# Patient Record
Sex: Female | Born: 1935 | Race: Black or African American | Hispanic: No | Marital: Married | Smoking: Never smoker
Health system: Southern US, Community
[De-identification: ages and names within clinical notes are randomized; demographics above are authoritative.]

## PROBLEM LIST (undated history)

## (undated) DIAGNOSIS — E7800 Pure hypercholesterolemia, unspecified: Secondary | ICD-10-CM

## (undated) DIAGNOSIS — I10 Essential (primary) hypertension: Secondary | ICD-10-CM

## (undated) DIAGNOSIS — I2510 Atherosclerotic heart disease of native coronary artery without angina pectoris: Secondary | ICD-10-CM

## (undated) HISTORY — DX: Essential (primary) hypertension: I10

## (undated) HISTORY — PX: HIP FRACTURE SURGERY: SHX118

## (undated) HISTORY — PX: TOTAL KNEE ARTHROPLASTY: SHX125

## (undated) HISTORY — DX: Atherosclerotic heart disease of native coronary artery without angina pectoris: I25.10

## (undated) HISTORY — DX: Pure hypercholesterolemia, unspecified: E78.00

## (undated) HISTORY — PX: CORONARY ARTERY BYPASS GRAFT: SHX141

---

## 2011-02-04 DIAGNOSIS — I359 Nonrheumatic aortic valve disorder, unspecified: Secondary | ICD-10-CM

## 2011-11-12 DIAGNOSIS — I10 Essential (primary) hypertension: Secondary | ICD-10-CM | POA: Diagnosis not present

## 2011-11-12 DIAGNOSIS — F0390 Unspecified dementia without behavioral disturbance: Secondary | ICD-10-CM | POA: Diagnosis not present

## 2011-11-12 DIAGNOSIS — I700 Atherosclerosis of aorta: Secondary | ICD-10-CM | POA: Diagnosis not present

## 2011-11-12 DIAGNOSIS — I2510 Atherosclerotic heart disease of native coronary artery without angina pectoris: Secondary | ICD-10-CM | POA: Diagnosis not present

## 2011-11-12 DIAGNOSIS — E119 Type 2 diabetes mellitus without complications: Secondary | ICD-10-CM | POA: Diagnosis not present

## 2011-11-12 DIAGNOSIS — K219 Gastro-esophageal reflux disease without esophagitis: Secondary | ICD-10-CM | POA: Diagnosis not present

## 2011-11-19 DIAGNOSIS — I10 Essential (primary) hypertension: Secondary | ICD-10-CM | POA: Diagnosis not present

## 2011-11-19 DIAGNOSIS — F0390 Unspecified dementia without behavioral disturbance: Secondary | ICD-10-CM | POA: Diagnosis not present

## 2011-11-19 DIAGNOSIS — I700 Atherosclerosis of aorta: Secondary | ICD-10-CM | POA: Diagnosis not present

## 2011-11-19 DIAGNOSIS — K219 Gastro-esophageal reflux disease without esophagitis: Secondary | ICD-10-CM | POA: Diagnosis not present

## 2011-11-19 DIAGNOSIS — E119 Type 2 diabetes mellitus without complications: Secondary | ICD-10-CM | POA: Diagnosis not present

## 2011-11-19 DIAGNOSIS — I2510 Atherosclerotic heart disease of native coronary artery without angina pectoris: Secondary | ICD-10-CM | POA: Diagnosis not present

## 2011-11-23 DIAGNOSIS — I2510 Atherosclerotic heart disease of native coronary artery without angina pectoris: Secondary | ICD-10-CM | POA: Diagnosis not present

## 2011-11-23 DIAGNOSIS — I10 Essential (primary) hypertension: Secondary | ICD-10-CM | POA: Diagnosis not present

## 2011-11-23 DIAGNOSIS — I700 Atherosclerosis of aorta: Secondary | ICD-10-CM | POA: Diagnosis not present

## 2011-11-23 DIAGNOSIS — E119 Type 2 diabetes mellitus without complications: Secondary | ICD-10-CM | POA: Diagnosis not present

## 2011-11-23 DIAGNOSIS — K219 Gastro-esophageal reflux disease without esophagitis: Secondary | ICD-10-CM | POA: Diagnosis not present

## 2011-11-23 DIAGNOSIS — F0390 Unspecified dementia without behavioral disturbance: Secondary | ICD-10-CM | POA: Diagnosis not present

## 2011-11-26 DIAGNOSIS — I2510 Atherosclerotic heart disease of native coronary artery without angina pectoris: Secondary | ICD-10-CM | POA: Diagnosis not present

## 2011-11-26 DIAGNOSIS — F0390 Unspecified dementia without behavioral disturbance: Secondary | ICD-10-CM | POA: Diagnosis not present

## 2011-11-26 DIAGNOSIS — E119 Type 2 diabetes mellitus without complications: Secondary | ICD-10-CM | POA: Diagnosis not present

## 2011-11-26 DIAGNOSIS — K219 Gastro-esophageal reflux disease without esophagitis: Secondary | ICD-10-CM | POA: Diagnosis not present

## 2011-11-26 DIAGNOSIS — I10 Essential (primary) hypertension: Secondary | ICD-10-CM | POA: Diagnosis not present

## 2011-11-26 DIAGNOSIS — I700 Atherosclerosis of aorta: Secondary | ICD-10-CM | POA: Diagnosis not present

## 2011-11-30 DIAGNOSIS — F0390 Unspecified dementia without behavioral disturbance: Secondary | ICD-10-CM | POA: Diagnosis not present

## 2011-11-30 DIAGNOSIS — K219 Gastro-esophageal reflux disease without esophagitis: Secondary | ICD-10-CM | POA: Diagnosis not present

## 2011-11-30 DIAGNOSIS — I10 Essential (primary) hypertension: Secondary | ICD-10-CM | POA: Diagnosis not present

## 2011-11-30 DIAGNOSIS — I2510 Atherosclerotic heart disease of native coronary artery without angina pectoris: Secondary | ICD-10-CM | POA: Diagnosis not present

## 2011-11-30 DIAGNOSIS — E119 Type 2 diabetes mellitus without complications: Secondary | ICD-10-CM | POA: Diagnosis not present

## 2011-11-30 DIAGNOSIS — I700 Atherosclerosis of aorta: Secondary | ICD-10-CM | POA: Diagnosis not present

## 2011-12-01 DIAGNOSIS — F0390 Unspecified dementia without behavioral disturbance: Secondary | ICD-10-CM | POA: Diagnosis not present

## 2011-12-01 DIAGNOSIS — I2510 Atherosclerotic heart disease of native coronary artery without angina pectoris: Secondary | ICD-10-CM | POA: Diagnosis not present

## 2011-12-01 DIAGNOSIS — I700 Atherosclerosis of aorta: Secondary | ICD-10-CM | POA: Diagnosis not present

## 2011-12-01 DIAGNOSIS — E119 Type 2 diabetes mellitus without complications: Secondary | ICD-10-CM | POA: Diagnosis not present

## 2011-12-01 DIAGNOSIS — K219 Gastro-esophageal reflux disease without esophagitis: Secondary | ICD-10-CM | POA: Diagnosis not present

## 2011-12-01 DIAGNOSIS — I10 Essential (primary) hypertension: Secondary | ICD-10-CM | POA: Diagnosis not present

## 2011-12-03 DIAGNOSIS — K219 Gastro-esophageal reflux disease without esophagitis: Secondary | ICD-10-CM | POA: Diagnosis not present

## 2011-12-03 DIAGNOSIS — E119 Type 2 diabetes mellitus without complications: Secondary | ICD-10-CM | POA: Diagnosis not present

## 2011-12-03 DIAGNOSIS — I700 Atherosclerosis of aorta: Secondary | ICD-10-CM | POA: Diagnosis not present

## 2011-12-03 DIAGNOSIS — F0390 Unspecified dementia without behavioral disturbance: Secondary | ICD-10-CM | POA: Diagnosis not present

## 2011-12-03 DIAGNOSIS — I2510 Atherosclerotic heart disease of native coronary artery without angina pectoris: Secondary | ICD-10-CM | POA: Diagnosis not present

## 2011-12-03 DIAGNOSIS — I10 Essential (primary) hypertension: Secondary | ICD-10-CM | POA: Diagnosis not present

## 2011-12-07 DIAGNOSIS — I2510 Atherosclerotic heart disease of native coronary artery without angina pectoris: Secondary | ICD-10-CM | POA: Diagnosis not present

## 2011-12-07 DIAGNOSIS — I700 Atherosclerosis of aorta: Secondary | ICD-10-CM | POA: Diagnosis not present

## 2011-12-07 DIAGNOSIS — I10 Essential (primary) hypertension: Secondary | ICD-10-CM | POA: Diagnosis not present

## 2011-12-07 DIAGNOSIS — E119 Type 2 diabetes mellitus without complications: Secondary | ICD-10-CM | POA: Diagnosis not present

## 2011-12-07 DIAGNOSIS — K219 Gastro-esophageal reflux disease without esophagitis: Secondary | ICD-10-CM | POA: Diagnosis not present

## 2011-12-07 DIAGNOSIS — F0390 Unspecified dementia without behavioral disturbance: Secondary | ICD-10-CM | POA: Diagnosis not present

## 2011-12-10 DIAGNOSIS — I10 Essential (primary) hypertension: Secondary | ICD-10-CM | POA: Diagnosis not present

## 2011-12-10 DIAGNOSIS — I700 Atherosclerosis of aorta: Secondary | ICD-10-CM | POA: Diagnosis not present

## 2011-12-10 DIAGNOSIS — F0390 Unspecified dementia without behavioral disturbance: Secondary | ICD-10-CM | POA: Diagnosis not present

## 2011-12-10 DIAGNOSIS — I2510 Atherosclerotic heart disease of native coronary artery without angina pectoris: Secondary | ICD-10-CM | POA: Diagnosis not present

## 2011-12-10 DIAGNOSIS — E119 Type 2 diabetes mellitus without complications: Secondary | ICD-10-CM | POA: Diagnosis not present

## 2011-12-10 DIAGNOSIS — K219 Gastro-esophageal reflux disease without esophagitis: Secondary | ICD-10-CM | POA: Diagnosis not present

## 2011-12-17 DIAGNOSIS — F0390 Unspecified dementia without behavioral disturbance: Secondary | ICD-10-CM | POA: Diagnosis not present

## 2011-12-17 DIAGNOSIS — K219 Gastro-esophageal reflux disease without esophagitis: Secondary | ICD-10-CM | POA: Diagnosis not present

## 2011-12-17 DIAGNOSIS — I700 Atherosclerosis of aorta: Secondary | ICD-10-CM | POA: Diagnosis not present

## 2011-12-17 DIAGNOSIS — I10 Essential (primary) hypertension: Secondary | ICD-10-CM | POA: Diagnosis not present

## 2011-12-17 DIAGNOSIS — I2510 Atherosclerotic heart disease of native coronary artery without angina pectoris: Secondary | ICD-10-CM | POA: Diagnosis not present

## 2011-12-17 DIAGNOSIS — E119 Type 2 diabetes mellitus without complications: Secondary | ICD-10-CM | POA: Diagnosis not present

## 2011-12-21 DIAGNOSIS — I2510 Atherosclerotic heart disease of native coronary artery without angina pectoris: Secondary | ICD-10-CM | POA: Diagnosis not present

## 2011-12-21 DIAGNOSIS — K219 Gastro-esophageal reflux disease without esophagitis: Secondary | ICD-10-CM | POA: Diagnosis not present

## 2011-12-21 DIAGNOSIS — I700 Atherosclerosis of aorta: Secondary | ICD-10-CM | POA: Diagnosis not present

## 2011-12-21 DIAGNOSIS — E119 Type 2 diabetes mellitus without complications: Secondary | ICD-10-CM | POA: Diagnosis not present

## 2011-12-21 DIAGNOSIS — I10 Essential (primary) hypertension: Secondary | ICD-10-CM | POA: Diagnosis not present

## 2011-12-21 DIAGNOSIS — F0390 Unspecified dementia without behavioral disturbance: Secondary | ICD-10-CM | POA: Diagnosis not present

## 2011-12-24 DIAGNOSIS — K219 Gastro-esophageal reflux disease without esophagitis: Secondary | ICD-10-CM | POA: Diagnosis not present

## 2011-12-24 DIAGNOSIS — I700 Atherosclerosis of aorta: Secondary | ICD-10-CM | POA: Diagnosis not present

## 2011-12-24 DIAGNOSIS — E119 Type 2 diabetes mellitus without complications: Secondary | ICD-10-CM | POA: Diagnosis not present

## 2011-12-24 DIAGNOSIS — I2510 Atherosclerotic heart disease of native coronary artery without angina pectoris: Secondary | ICD-10-CM | POA: Diagnosis not present

## 2011-12-24 DIAGNOSIS — F0390 Unspecified dementia without behavioral disturbance: Secondary | ICD-10-CM | POA: Diagnosis not present

## 2011-12-24 DIAGNOSIS — I10 Essential (primary) hypertension: Secondary | ICD-10-CM | POA: Diagnosis not present

## 2011-12-28 DIAGNOSIS — E119 Type 2 diabetes mellitus without complications: Secondary | ICD-10-CM | POA: Diagnosis not present

## 2011-12-28 DIAGNOSIS — F0390 Unspecified dementia without behavioral disturbance: Secondary | ICD-10-CM | POA: Diagnosis not present

## 2011-12-28 DIAGNOSIS — K219 Gastro-esophageal reflux disease without esophagitis: Secondary | ICD-10-CM | POA: Diagnosis not present

## 2011-12-28 DIAGNOSIS — I10 Essential (primary) hypertension: Secondary | ICD-10-CM | POA: Diagnosis not present

## 2011-12-28 DIAGNOSIS — I700 Atherosclerosis of aorta: Secondary | ICD-10-CM | POA: Diagnosis not present

## 2011-12-28 DIAGNOSIS — I2510 Atherosclerotic heart disease of native coronary artery without angina pectoris: Secondary | ICD-10-CM | POA: Diagnosis not present

## 2011-12-29 DIAGNOSIS — E119 Type 2 diabetes mellitus without complications: Secondary | ICD-10-CM | POA: Diagnosis not present

## 2011-12-29 DIAGNOSIS — I2510 Atherosclerotic heart disease of native coronary artery without angina pectoris: Secondary | ICD-10-CM | POA: Diagnosis not present

## 2011-12-29 DIAGNOSIS — K219 Gastro-esophageal reflux disease without esophagitis: Secondary | ICD-10-CM | POA: Diagnosis not present

## 2011-12-29 DIAGNOSIS — I700 Atherosclerosis of aorta: Secondary | ICD-10-CM | POA: Diagnosis not present

## 2011-12-29 DIAGNOSIS — I10 Essential (primary) hypertension: Secondary | ICD-10-CM | POA: Diagnosis not present

## 2011-12-29 DIAGNOSIS — F0390 Unspecified dementia without behavioral disturbance: Secondary | ICD-10-CM | POA: Diagnosis not present

## 2011-12-31 DIAGNOSIS — I700 Atherosclerosis of aorta: Secondary | ICD-10-CM | POA: Diagnosis not present

## 2011-12-31 DIAGNOSIS — I10 Essential (primary) hypertension: Secondary | ICD-10-CM | POA: Diagnosis not present

## 2011-12-31 DIAGNOSIS — E119 Type 2 diabetes mellitus without complications: Secondary | ICD-10-CM | POA: Diagnosis not present

## 2011-12-31 DIAGNOSIS — F0390 Unspecified dementia without behavioral disturbance: Secondary | ICD-10-CM | POA: Diagnosis not present

## 2011-12-31 DIAGNOSIS — K219 Gastro-esophageal reflux disease without esophagitis: Secondary | ICD-10-CM | POA: Diagnosis not present

## 2011-12-31 DIAGNOSIS — I2510 Atherosclerotic heart disease of native coronary artery without angina pectoris: Secondary | ICD-10-CM | POA: Diagnosis not present

## 2012-01-04 DIAGNOSIS — I2510 Atherosclerotic heart disease of native coronary artery without angina pectoris: Secondary | ICD-10-CM | POA: Diagnosis not present

## 2012-01-04 DIAGNOSIS — K219 Gastro-esophageal reflux disease without esophagitis: Secondary | ICD-10-CM | POA: Diagnosis not present

## 2012-01-04 DIAGNOSIS — I700 Atherosclerosis of aorta: Secondary | ICD-10-CM | POA: Diagnosis not present

## 2012-01-04 DIAGNOSIS — E119 Type 2 diabetes mellitus without complications: Secondary | ICD-10-CM | POA: Diagnosis not present

## 2012-01-04 DIAGNOSIS — F0390 Unspecified dementia without behavioral disturbance: Secondary | ICD-10-CM | POA: Diagnosis not present

## 2012-01-04 DIAGNOSIS — I10 Essential (primary) hypertension: Secondary | ICD-10-CM | POA: Diagnosis not present

## 2012-01-07 DIAGNOSIS — M159 Polyosteoarthritis, unspecified: Secondary | ICD-10-CM | POA: Diagnosis not present

## 2012-01-07 DIAGNOSIS — E1310 Other specified diabetes mellitus with ketoacidosis without coma: Secondary | ICD-10-CM | POA: Diagnosis not present

## 2012-01-07 DIAGNOSIS — F0390 Unspecified dementia without behavioral disturbance: Secondary | ICD-10-CM | POA: Diagnosis not present

## 2012-01-07 DIAGNOSIS — I10 Essential (primary) hypertension: Secondary | ICD-10-CM | POA: Diagnosis not present

## 2012-01-11 DIAGNOSIS — E119 Type 2 diabetes mellitus without complications: Secondary | ICD-10-CM | POA: Diagnosis not present

## 2012-01-11 DIAGNOSIS — F0390 Unspecified dementia without behavioral disturbance: Secondary | ICD-10-CM | POA: Diagnosis not present

## 2012-01-11 DIAGNOSIS — I10 Essential (primary) hypertension: Secondary | ICD-10-CM | POA: Diagnosis not present

## 2012-01-11 DIAGNOSIS — I2510 Atherosclerotic heart disease of native coronary artery without angina pectoris: Secondary | ICD-10-CM | POA: Diagnosis not present

## 2012-01-11 DIAGNOSIS — I700 Atherosclerosis of aorta: Secondary | ICD-10-CM | POA: Diagnosis not present

## 2012-01-11 DIAGNOSIS — K219 Gastro-esophageal reflux disease without esophagitis: Secondary | ICD-10-CM | POA: Diagnosis not present

## 2012-01-14 DIAGNOSIS — K219 Gastro-esophageal reflux disease without esophagitis: Secondary | ICD-10-CM | POA: Diagnosis not present

## 2012-01-14 DIAGNOSIS — I700 Atherosclerosis of aorta: Secondary | ICD-10-CM | POA: Diagnosis not present

## 2012-01-14 DIAGNOSIS — F0390 Unspecified dementia without behavioral disturbance: Secondary | ICD-10-CM | POA: Diagnosis not present

## 2012-01-14 DIAGNOSIS — I10 Essential (primary) hypertension: Secondary | ICD-10-CM | POA: Diagnosis not present

## 2012-01-14 DIAGNOSIS — I2510 Atherosclerotic heart disease of native coronary artery without angina pectoris: Secondary | ICD-10-CM | POA: Diagnosis not present

## 2012-01-14 DIAGNOSIS — E119 Type 2 diabetes mellitus without complications: Secondary | ICD-10-CM | POA: Diagnosis not present

## 2012-01-18 DIAGNOSIS — I700 Atherosclerosis of aorta: Secondary | ICD-10-CM | POA: Diagnosis not present

## 2012-01-18 DIAGNOSIS — I10 Essential (primary) hypertension: Secondary | ICD-10-CM | POA: Diagnosis not present

## 2012-01-18 DIAGNOSIS — I2510 Atherosclerotic heart disease of native coronary artery without angina pectoris: Secondary | ICD-10-CM | POA: Diagnosis not present

## 2012-01-18 DIAGNOSIS — K219 Gastro-esophageal reflux disease without esophagitis: Secondary | ICD-10-CM | POA: Diagnosis not present

## 2012-01-18 DIAGNOSIS — E119 Type 2 diabetes mellitus without complications: Secondary | ICD-10-CM | POA: Diagnosis not present

## 2012-01-18 DIAGNOSIS — F0390 Unspecified dementia without behavioral disturbance: Secondary | ICD-10-CM | POA: Diagnosis not present

## 2012-01-21 DIAGNOSIS — E119 Type 2 diabetes mellitus without complications: Secondary | ICD-10-CM | POA: Diagnosis not present

## 2012-01-21 DIAGNOSIS — I700 Atherosclerosis of aorta: Secondary | ICD-10-CM | POA: Diagnosis not present

## 2012-01-21 DIAGNOSIS — F0390 Unspecified dementia without behavioral disturbance: Secondary | ICD-10-CM | POA: Diagnosis not present

## 2012-01-21 DIAGNOSIS — K219 Gastro-esophageal reflux disease without esophagitis: Secondary | ICD-10-CM | POA: Diagnosis not present

## 2012-01-21 DIAGNOSIS — I10 Essential (primary) hypertension: Secondary | ICD-10-CM | POA: Diagnosis not present

## 2012-01-21 DIAGNOSIS — I2510 Atherosclerotic heart disease of native coronary artery without angina pectoris: Secondary | ICD-10-CM | POA: Diagnosis not present

## 2012-01-25 DIAGNOSIS — K219 Gastro-esophageal reflux disease without esophagitis: Secondary | ICD-10-CM | POA: Diagnosis not present

## 2012-01-25 DIAGNOSIS — I10 Essential (primary) hypertension: Secondary | ICD-10-CM | POA: Diagnosis not present

## 2012-01-25 DIAGNOSIS — I2510 Atherosclerotic heart disease of native coronary artery without angina pectoris: Secondary | ICD-10-CM | POA: Diagnosis not present

## 2012-01-25 DIAGNOSIS — F0390 Unspecified dementia without behavioral disturbance: Secondary | ICD-10-CM | POA: Diagnosis not present

## 2012-01-25 DIAGNOSIS — E119 Type 2 diabetes mellitus without complications: Secondary | ICD-10-CM | POA: Diagnosis not present

## 2012-01-25 DIAGNOSIS — I700 Atherosclerosis of aorta: Secondary | ICD-10-CM | POA: Diagnosis not present

## 2012-01-26 DIAGNOSIS — F0390 Unspecified dementia without behavioral disturbance: Secondary | ICD-10-CM | POA: Diagnosis not present

## 2012-01-26 DIAGNOSIS — I700 Atherosclerosis of aorta: Secondary | ICD-10-CM | POA: Diagnosis not present

## 2012-01-26 DIAGNOSIS — I2510 Atherosclerotic heart disease of native coronary artery without angina pectoris: Secondary | ICD-10-CM | POA: Diagnosis not present

## 2012-01-26 DIAGNOSIS — I10 Essential (primary) hypertension: Secondary | ICD-10-CM | POA: Diagnosis not present

## 2012-01-26 DIAGNOSIS — E119 Type 2 diabetes mellitus without complications: Secondary | ICD-10-CM | POA: Diagnosis not present

## 2012-01-26 DIAGNOSIS — K219 Gastro-esophageal reflux disease without esophagitis: Secondary | ICD-10-CM | POA: Diagnosis not present

## 2012-01-28 DIAGNOSIS — I10 Essential (primary) hypertension: Secondary | ICD-10-CM | POA: Diagnosis not present

## 2012-01-28 DIAGNOSIS — F0390 Unspecified dementia without behavioral disturbance: Secondary | ICD-10-CM | POA: Diagnosis not present

## 2012-01-28 DIAGNOSIS — K219 Gastro-esophageal reflux disease without esophagitis: Secondary | ICD-10-CM | POA: Diagnosis not present

## 2012-01-28 DIAGNOSIS — E119 Type 2 diabetes mellitus without complications: Secondary | ICD-10-CM | POA: Diagnosis not present

## 2012-01-28 DIAGNOSIS — I700 Atherosclerosis of aorta: Secondary | ICD-10-CM | POA: Diagnosis not present

## 2012-01-28 DIAGNOSIS — I2510 Atherosclerotic heart disease of native coronary artery without angina pectoris: Secondary | ICD-10-CM | POA: Diagnosis not present

## 2012-02-01 DIAGNOSIS — I10 Essential (primary) hypertension: Secondary | ICD-10-CM | POA: Diagnosis not present

## 2012-02-01 DIAGNOSIS — I2510 Atherosclerotic heart disease of native coronary artery without angina pectoris: Secondary | ICD-10-CM | POA: Diagnosis not present

## 2012-02-01 DIAGNOSIS — E119 Type 2 diabetes mellitus without complications: Secondary | ICD-10-CM | POA: Diagnosis not present

## 2012-02-01 DIAGNOSIS — F0390 Unspecified dementia without behavioral disturbance: Secondary | ICD-10-CM | POA: Diagnosis not present

## 2012-02-01 DIAGNOSIS — I700 Atherosclerosis of aorta: Secondary | ICD-10-CM | POA: Diagnosis not present

## 2012-02-01 DIAGNOSIS — K219 Gastro-esophageal reflux disease without esophagitis: Secondary | ICD-10-CM | POA: Diagnosis not present

## 2012-02-04 DIAGNOSIS — K219 Gastro-esophageal reflux disease without esophagitis: Secondary | ICD-10-CM | POA: Diagnosis not present

## 2012-02-04 DIAGNOSIS — I700 Atherosclerosis of aorta: Secondary | ICD-10-CM | POA: Diagnosis not present

## 2012-02-04 DIAGNOSIS — I10 Essential (primary) hypertension: Secondary | ICD-10-CM | POA: Diagnosis not present

## 2012-02-04 DIAGNOSIS — E119 Type 2 diabetes mellitus without complications: Secondary | ICD-10-CM | POA: Diagnosis not present

## 2012-02-04 DIAGNOSIS — I2510 Atherosclerotic heart disease of native coronary artery without angina pectoris: Secondary | ICD-10-CM | POA: Diagnosis not present

## 2012-02-04 DIAGNOSIS — F0390 Unspecified dementia without behavioral disturbance: Secondary | ICD-10-CM | POA: Diagnosis not present

## 2012-02-08 DIAGNOSIS — E119 Type 2 diabetes mellitus without complications: Secondary | ICD-10-CM | POA: Diagnosis not present

## 2012-02-08 DIAGNOSIS — I10 Essential (primary) hypertension: Secondary | ICD-10-CM | POA: Diagnosis not present

## 2012-02-08 DIAGNOSIS — F0390 Unspecified dementia without behavioral disturbance: Secondary | ICD-10-CM | POA: Diagnosis not present

## 2012-02-08 DIAGNOSIS — K219 Gastro-esophageal reflux disease without esophagitis: Secondary | ICD-10-CM | POA: Diagnosis not present

## 2012-02-08 DIAGNOSIS — I700 Atherosclerosis of aorta: Secondary | ICD-10-CM | POA: Diagnosis not present

## 2012-02-08 DIAGNOSIS — I2510 Atherosclerotic heart disease of native coronary artery without angina pectoris: Secondary | ICD-10-CM | POA: Diagnosis not present

## 2012-02-11 DIAGNOSIS — I2510 Atherosclerotic heart disease of native coronary artery without angina pectoris: Secondary | ICD-10-CM | POA: Diagnosis not present

## 2012-02-11 DIAGNOSIS — I10 Essential (primary) hypertension: Secondary | ICD-10-CM | POA: Diagnosis not present

## 2012-02-11 DIAGNOSIS — K219 Gastro-esophageal reflux disease without esophagitis: Secondary | ICD-10-CM | POA: Diagnosis not present

## 2012-02-11 DIAGNOSIS — I700 Atherosclerosis of aorta: Secondary | ICD-10-CM | POA: Diagnosis not present

## 2012-02-11 DIAGNOSIS — F0390 Unspecified dementia without behavioral disturbance: Secondary | ICD-10-CM | POA: Diagnosis not present

## 2012-02-11 DIAGNOSIS — E119 Type 2 diabetes mellitus without complications: Secondary | ICD-10-CM | POA: Diagnosis not present

## 2012-02-15 DIAGNOSIS — E119 Type 2 diabetes mellitus without complications: Secondary | ICD-10-CM | POA: Diagnosis not present

## 2012-02-15 DIAGNOSIS — I10 Essential (primary) hypertension: Secondary | ICD-10-CM | POA: Diagnosis not present

## 2012-02-15 DIAGNOSIS — F0390 Unspecified dementia without behavioral disturbance: Secondary | ICD-10-CM | POA: Diagnosis not present

## 2012-02-15 DIAGNOSIS — I2510 Atherosclerotic heart disease of native coronary artery without angina pectoris: Secondary | ICD-10-CM | POA: Diagnosis not present

## 2012-02-15 DIAGNOSIS — I700 Atherosclerosis of aorta: Secondary | ICD-10-CM | POA: Diagnosis not present

## 2012-02-15 DIAGNOSIS — K219 Gastro-esophageal reflux disease without esophagitis: Secondary | ICD-10-CM | POA: Diagnosis not present

## 2012-02-18 DIAGNOSIS — I2510 Atherosclerotic heart disease of native coronary artery without angina pectoris: Secondary | ICD-10-CM | POA: Diagnosis not present

## 2012-02-18 DIAGNOSIS — E119 Type 2 diabetes mellitus without complications: Secondary | ICD-10-CM | POA: Diagnosis not present

## 2012-02-18 DIAGNOSIS — F0390 Unspecified dementia without behavioral disturbance: Secondary | ICD-10-CM | POA: Diagnosis not present

## 2012-02-18 DIAGNOSIS — I700 Atherosclerosis of aorta: Secondary | ICD-10-CM | POA: Diagnosis not present

## 2012-02-18 DIAGNOSIS — K219 Gastro-esophageal reflux disease without esophagitis: Secondary | ICD-10-CM | POA: Diagnosis not present

## 2012-02-18 DIAGNOSIS — I10 Essential (primary) hypertension: Secondary | ICD-10-CM | POA: Diagnosis not present

## 2012-02-23 DIAGNOSIS — I2510 Atherosclerotic heart disease of native coronary artery without angina pectoris: Secondary | ICD-10-CM | POA: Diagnosis not present

## 2012-02-23 DIAGNOSIS — I10 Essential (primary) hypertension: Secondary | ICD-10-CM | POA: Diagnosis not present

## 2012-02-23 DIAGNOSIS — F0390 Unspecified dementia without behavioral disturbance: Secondary | ICD-10-CM | POA: Diagnosis not present

## 2012-02-23 DIAGNOSIS — K219 Gastro-esophageal reflux disease without esophagitis: Secondary | ICD-10-CM | POA: Diagnosis not present

## 2012-02-23 DIAGNOSIS — I700 Atherosclerosis of aorta: Secondary | ICD-10-CM | POA: Diagnosis not present

## 2012-02-23 DIAGNOSIS — E119 Type 2 diabetes mellitus without complications: Secondary | ICD-10-CM | POA: Diagnosis not present

## 2012-02-25 DIAGNOSIS — E119 Type 2 diabetes mellitus without complications: Secondary | ICD-10-CM | POA: Diagnosis not present

## 2012-02-25 DIAGNOSIS — I700 Atherosclerosis of aorta: Secondary | ICD-10-CM | POA: Diagnosis not present

## 2012-02-25 DIAGNOSIS — K219 Gastro-esophageal reflux disease without esophagitis: Secondary | ICD-10-CM | POA: Diagnosis not present

## 2012-02-25 DIAGNOSIS — I2510 Atherosclerotic heart disease of native coronary artery without angina pectoris: Secondary | ICD-10-CM | POA: Diagnosis not present

## 2012-02-25 DIAGNOSIS — I10 Essential (primary) hypertension: Secondary | ICD-10-CM | POA: Diagnosis not present

## 2012-02-25 DIAGNOSIS — F0390 Unspecified dementia without behavioral disturbance: Secondary | ICD-10-CM | POA: Diagnosis not present

## 2012-02-29 DIAGNOSIS — E1139 Type 2 diabetes mellitus with other diabetic ophthalmic complication: Secondary | ICD-10-CM | POA: Diagnosis not present

## 2012-02-29 DIAGNOSIS — H40019 Open angle with borderline findings, low risk, unspecified eye: Secondary | ICD-10-CM | POA: Diagnosis not present

## 2012-02-29 DIAGNOSIS — H35049 Retinal micro-aneurysms, unspecified, unspecified eye: Secondary | ICD-10-CM | POA: Diagnosis not present

## 2012-02-29 DIAGNOSIS — H409 Unspecified glaucoma: Secondary | ICD-10-CM | POA: Diagnosis not present

## 2012-03-07 DIAGNOSIS — E119 Type 2 diabetes mellitus without complications: Secondary | ICD-10-CM | POA: Diagnosis not present

## 2012-03-07 DIAGNOSIS — I700 Atherosclerosis of aorta: Secondary | ICD-10-CM | POA: Diagnosis not present

## 2012-03-07 DIAGNOSIS — F0390 Unspecified dementia without behavioral disturbance: Secondary | ICD-10-CM | POA: Diagnosis not present

## 2012-03-07 DIAGNOSIS — K219 Gastro-esophageal reflux disease without esophagitis: Secondary | ICD-10-CM | POA: Diagnosis not present

## 2012-03-07 DIAGNOSIS — I2510 Atherosclerotic heart disease of native coronary artery without angina pectoris: Secondary | ICD-10-CM | POA: Diagnosis not present

## 2012-03-07 DIAGNOSIS — I10 Essential (primary) hypertension: Secondary | ICD-10-CM | POA: Diagnosis not present

## 2012-03-10 DIAGNOSIS — I10 Essential (primary) hypertension: Secondary | ICD-10-CM | POA: Diagnosis not present

## 2012-03-10 DIAGNOSIS — I2510 Atherosclerotic heart disease of native coronary artery without angina pectoris: Secondary | ICD-10-CM | POA: Diagnosis not present

## 2012-03-10 DIAGNOSIS — F0390 Unspecified dementia without behavioral disturbance: Secondary | ICD-10-CM | POA: Diagnosis not present

## 2012-03-10 DIAGNOSIS — K219 Gastro-esophageal reflux disease without esophagitis: Secondary | ICD-10-CM | POA: Diagnosis not present

## 2012-03-10 DIAGNOSIS — E119 Type 2 diabetes mellitus without complications: Secondary | ICD-10-CM | POA: Diagnosis not present

## 2012-03-10 DIAGNOSIS — I700 Atherosclerosis of aorta: Secondary | ICD-10-CM | POA: Diagnosis not present

## 2012-03-11 DIAGNOSIS — I2510 Atherosclerotic heart disease of native coronary artery without angina pectoris: Secondary | ICD-10-CM | POA: Diagnosis not present

## 2012-03-11 DIAGNOSIS — E119 Type 2 diabetes mellitus without complications: Secondary | ICD-10-CM | POA: Diagnosis not present

## 2012-03-11 DIAGNOSIS — F0390 Unspecified dementia without behavioral disturbance: Secondary | ICD-10-CM | POA: Diagnosis not present

## 2012-03-11 DIAGNOSIS — I10 Essential (primary) hypertension: Secondary | ICD-10-CM | POA: Diagnosis not present

## 2012-03-11 DIAGNOSIS — I700 Atherosclerosis of aorta: Secondary | ICD-10-CM | POA: Diagnosis not present

## 2012-03-11 DIAGNOSIS — K219 Gastro-esophageal reflux disease without esophagitis: Secondary | ICD-10-CM | POA: Diagnosis not present

## 2012-03-14 DIAGNOSIS — I10 Essential (primary) hypertension: Secondary | ICD-10-CM | POA: Diagnosis not present

## 2012-03-14 DIAGNOSIS — I2510 Atherosclerotic heart disease of native coronary artery without angina pectoris: Secondary | ICD-10-CM | POA: Diagnosis not present

## 2012-03-14 DIAGNOSIS — E119 Type 2 diabetes mellitus without complications: Secondary | ICD-10-CM | POA: Diagnosis not present

## 2012-03-14 DIAGNOSIS — I700 Atherosclerosis of aorta: Secondary | ICD-10-CM | POA: Diagnosis not present

## 2012-03-14 DIAGNOSIS — F0390 Unspecified dementia without behavioral disturbance: Secondary | ICD-10-CM | POA: Diagnosis not present

## 2012-03-14 DIAGNOSIS — K219 Gastro-esophageal reflux disease without esophagitis: Secondary | ICD-10-CM | POA: Diagnosis not present

## 2012-03-17 DIAGNOSIS — F0390 Unspecified dementia without behavioral disturbance: Secondary | ICD-10-CM | POA: Diagnosis not present

## 2012-03-17 DIAGNOSIS — I10 Essential (primary) hypertension: Secondary | ICD-10-CM | POA: Diagnosis not present

## 2012-03-17 DIAGNOSIS — I2510 Atherosclerotic heart disease of native coronary artery without angina pectoris: Secondary | ICD-10-CM | POA: Diagnosis not present

## 2012-03-17 DIAGNOSIS — E119 Type 2 diabetes mellitus without complications: Secondary | ICD-10-CM | POA: Diagnosis not present

## 2012-03-17 DIAGNOSIS — I700 Atherosclerosis of aorta: Secondary | ICD-10-CM | POA: Diagnosis not present

## 2012-03-17 DIAGNOSIS — K219 Gastro-esophageal reflux disease without esophagitis: Secondary | ICD-10-CM | POA: Diagnosis not present

## 2012-03-21 DIAGNOSIS — F0390 Unspecified dementia without behavioral disturbance: Secondary | ICD-10-CM | POA: Diagnosis not present

## 2012-03-21 DIAGNOSIS — K219 Gastro-esophageal reflux disease without esophagitis: Secondary | ICD-10-CM | POA: Diagnosis not present

## 2012-03-21 DIAGNOSIS — E119 Type 2 diabetes mellitus without complications: Secondary | ICD-10-CM | POA: Diagnosis not present

## 2012-03-21 DIAGNOSIS — I700 Atherosclerosis of aorta: Secondary | ICD-10-CM | POA: Diagnosis not present

## 2012-03-21 DIAGNOSIS — I2510 Atherosclerotic heart disease of native coronary artery without angina pectoris: Secondary | ICD-10-CM | POA: Diagnosis not present

## 2012-03-21 DIAGNOSIS — I10 Essential (primary) hypertension: Secondary | ICD-10-CM | POA: Diagnosis not present

## 2012-03-24 DIAGNOSIS — E119 Type 2 diabetes mellitus without complications: Secondary | ICD-10-CM | POA: Diagnosis not present

## 2012-03-24 DIAGNOSIS — F0390 Unspecified dementia without behavioral disturbance: Secondary | ICD-10-CM | POA: Diagnosis not present

## 2012-03-24 DIAGNOSIS — I700 Atherosclerosis of aorta: Secondary | ICD-10-CM | POA: Diagnosis not present

## 2012-03-24 DIAGNOSIS — K219 Gastro-esophageal reflux disease without esophagitis: Secondary | ICD-10-CM | POA: Diagnosis not present

## 2012-03-24 DIAGNOSIS — I2510 Atherosclerotic heart disease of native coronary artery without angina pectoris: Secondary | ICD-10-CM | POA: Diagnosis not present

## 2012-03-24 DIAGNOSIS — I10 Essential (primary) hypertension: Secondary | ICD-10-CM | POA: Diagnosis not present

## 2012-03-28 DIAGNOSIS — K219 Gastro-esophageal reflux disease without esophagitis: Secondary | ICD-10-CM | POA: Diagnosis not present

## 2012-03-28 DIAGNOSIS — E119 Type 2 diabetes mellitus without complications: Secondary | ICD-10-CM | POA: Diagnosis not present

## 2012-03-28 DIAGNOSIS — F0390 Unspecified dementia without behavioral disturbance: Secondary | ICD-10-CM | POA: Diagnosis not present

## 2012-03-28 DIAGNOSIS — I2510 Atherosclerotic heart disease of native coronary artery without angina pectoris: Secondary | ICD-10-CM | POA: Diagnosis not present

## 2012-03-28 DIAGNOSIS — I700 Atherosclerosis of aorta: Secondary | ICD-10-CM | POA: Diagnosis not present

## 2012-03-28 DIAGNOSIS — I10 Essential (primary) hypertension: Secondary | ICD-10-CM | POA: Diagnosis not present

## 2012-03-31 DIAGNOSIS — F0390 Unspecified dementia without behavioral disturbance: Secondary | ICD-10-CM | POA: Diagnosis not present

## 2012-03-31 DIAGNOSIS — K219 Gastro-esophageal reflux disease without esophagitis: Secondary | ICD-10-CM | POA: Diagnosis not present

## 2012-03-31 DIAGNOSIS — I10 Essential (primary) hypertension: Secondary | ICD-10-CM | POA: Diagnosis not present

## 2012-03-31 DIAGNOSIS — E119 Type 2 diabetes mellitus without complications: Secondary | ICD-10-CM | POA: Diagnosis not present

## 2012-03-31 DIAGNOSIS — I2510 Atherosclerotic heart disease of native coronary artery without angina pectoris: Secondary | ICD-10-CM | POA: Diagnosis not present

## 2012-03-31 DIAGNOSIS — I700 Atherosclerosis of aorta: Secondary | ICD-10-CM | POA: Diagnosis not present

## 2012-04-04 DIAGNOSIS — I700 Atherosclerosis of aorta: Secondary | ICD-10-CM | POA: Diagnosis not present

## 2012-04-04 DIAGNOSIS — E119 Type 2 diabetes mellitus without complications: Secondary | ICD-10-CM | POA: Diagnosis not present

## 2012-04-04 DIAGNOSIS — I10 Essential (primary) hypertension: Secondary | ICD-10-CM | POA: Diagnosis not present

## 2012-04-04 DIAGNOSIS — F0390 Unspecified dementia without behavioral disturbance: Secondary | ICD-10-CM | POA: Diagnosis not present

## 2012-04-04 DIAGNOSIS — I2510 Atherosclerotic heart disease of native coronary artery without angina pectoris: Secondary | ICD-10-CM | POA: Diagnosis not present

## 2012-04-04 DIAGNOSIS — K219 Gastro-esophageal reflux disease without esophagitis: Secondary | ICD-10-CM | POA: Diagnosis not present

## 2012-04-05 DIAGNOSIS — I10 Essential (primary) hypertension: Secondary | ICD-10-CM | POA: Diagnosis not present

## 2012-04-05 DIAGNOSIS — E119 Type 2 diabetes mellitus without complications: Secondary | ICD-10-CM | POA: Diagnosis not present

## 2012-04-05 DIAGNOSIS — I700 Atherosclerosis of aorta: Secondary | ICD-10-CM | POA: Diagnosis not present

## 2012-04-05 DIAGNOSIS — F0390 Unspecified dementia without behavioral disturbance: Secondary | ICD-10-CM | POA: Diagnosis not present

## 2012-04-05 DIAGNOSIS — K219 Gastro-esophageal reflux disease without esophagitis: Secondary | ICD-10-CM | POA: Diagnosis not present

## 2012-04-05 DIAGNOSIS — I2510 Atherosclerotic heart disease of native coronary artery without angina pectoris: Secondary | ICD-10-CM | POA: Diagnosis not present

## 2012-04-06 DIAGNOSIS — E1310 Other specified diabetes mellitus with ketoacidosis without coma: Secondary | ICD-10-CM | POA: Diagnosis not present

## 2012-04-06 DIAGNOSIS — E782 Mixed hyperlipidemia: Secondary | ICD-10-CM | POA: Diagnosis not present

## 2012-04-06 DIAGNOSIS — I10 Essential (primary) hypertension: Secondary | ICD-10-CM | POA: Diagnosis not present

## 2012-04-07 DIAGNOSIS — F0390 Unspecified dementia without behavioral disturbance: Secondary | ICD-10-CM | POA: Diagnosis not present

## 2012-04-07 DIAGNOSIS — I2510 Atherosclerotic heart disease of native coronary artery without angina pectoris: Secondary | ICD-10-CM | POA: Diagnosis not present

## 2012-04-07 DIAGNOSIS — I10 Essential (primary) hypertension: Secondary | ICD-10-CM | POA: Diagnosis not present

## 2012-04-07 DIAGNOSIS — I700 Atherosclerosis of aorta: Secondary | ICD-10-CM | POA: Diagnosis not present

## 2012-04-07 DIAGNOSIS — K219 Gastro-esophageal reflux disease without esophagitis: Secondary | ICD-10-CM | POA: Diagnosis not present

## 2012-04-07 DIAGNOSIS — E119 Type 2 diabetes mellitus without complications: Secondary | ICD-10-CM | POA: Diagnosis not present

## 2012-04-11 DIAGNOSIS — I10 Essential (primary) hypertension: Secondary | ICD-10-CM | POA: Diagnosis not present

## 2012-04-11 DIAGNOSIS — K219 Gastro-esophageal reflux disease without esophagitis: Secondary | ICD-10-CM | POA: Diagnosis not present

## 2012-04-11 DIAGNOSIS — I2510 Atherosclerotic heart disease of native coronary artery without angina pectoris: Secondary | ICD-10-CM | POA: Diagnosis not present

## 2012-04-11 DIAGNOSIS — I700 Atherosclerosis of aorta: Secondary | ICD-10-CM | POA: Diagnosis not present

## 2012-04-11 DIAGNOSIS — E119 Type 2 diabetes mellitus without complications: Secondary | ICD-10-CM | POA: Diagnosis not present

## 2012-04-11 DIAGNOSIS — F0390 Unspecified dementia without behavioral disturbance: Secondary | ICD-10-CM | POA: Diagnosis not present

## 2012-04-14 DIAGNOSIS — I2510 Atherosclerotic heart disease of native coronary artery without angina pectoris: Secondary | ICD-10-CM | POA: Diagnosis not present

## 2012-04-14 DIAGNOSIS — E119 Type 2 diabetes mellitus without complications: Secondary | ICD-10-CM | POA: Diagnosis not present

## 2012-04-14 DIAGNOSIS — K219 Gastro-esophageal reflux disease without esophagitis: Secondary | ICD-10-CM | POA: Diagnosis not present

## 2012-04-14 DIAGNOSIS — I10 Essential (primary) hypertension: Secondary | ICD-10-CM | POA: Diagnosis not present

## 2012-04-14 DIAGNOSIS — I700 Atherosclerosis of aorta: Secondary | ICD-10-CM | POA: Diagnosis not present

## 2012-04-14 DIAGNOSIS — F0390 Unspecified dementia without behavioral disturbance: Secondary | ICD-10-CM | POA: Diagnosis not present

## 2012-04-18 DIAGNOSIS — K219 Gastro-esophageal reflux disease without esophagitis: Secondary | ICD-10-CM | POA: Diagnosis not present

## 2012-04-18 DIAGNOSIS — F0390 Unspecified dementia without behavioral disturbance: Secondary | ICD-10-CM | POA: Diagnosis not present

## 2012-04-18 DIAGNOSIS — E119 Type 2 diabetes mellitus without complications: Secondary | ICD-10-CM | POA: Diagnosis not present

## 2012-04-18 DIAGNOSIS — I700 Atherosclerosis of aorta: Secondary | ICD-10-CM | POA: Diagnosis not present

## 2012-04-18 DIAGNOSIS — I10 Essential (primary) hypertension: Secondary | ICD-10-CM | POA: Diagnosis not present

## 2012-04-18 DIAGNOSIS — I2510 Atherosclerotic heart disease of native coronary artery without angina pectoris: Secondary | ICD-10-CM | POA: Diagnosis not present

## 2012-04-21 DIAGNOSIS — K219 Gastro-esophageal reflux disease without esophagitis: Secondary | ICD-10-CM | POA: Diagnosis not present

## 2012-04-21 DIAGNOSIS — I700 Atherosclerosis of aorta: Secondary | ICD-10-CM | POA: Diagnosis not present

## 2012-04-21 DIAGNOSIS — I10 Essential (primary) hypertension: Secondary | ICD-10-CM | POA: Diagnosis not present

## 2012-04-21 DIAGNOSIS — F0390 Unspecified dementia without behavioral disturbance: Secondary | ICD-10-CM | POA: Diagnosis not present

## 2012-04-21 DIAGNOSIS — E119 Type 2 diabetes mellitus without complications: Secondary | ICD-10-CM | POA: Diagnosis not present

## 2012-04-21 DIAGNOSIS — I2510 Atherosclerotic heart disease of native coronary artery without angina pectoris: Secondary | ICD-10-CM | POA: Diagnosis not present

## 2012-04-25 DIAGNOSIS — K219 Gastro-esophageal reflux disease without esophagitis: Secondary | ICD-10-CM | POA: Diagnosis not present

## 2012-04-25 DIAGNOSIS — E119 Type 2 diabetes mellitus without complications: Secondary | ICD-10-CM | POA: Diagnosis not present

## 2012-04-25 DIAGNOSIS — I700 Atherosclerosis of aorta: Secondary | ICD-10-CM | POA: Diagnosis not present

## 2012-04-25 DIAGNOSIS — F0390 Unspecified dementia without behavioral disturbance: Secondary | ICD-10-CM | POA: Diagnosis not present

## 2012-04-25 DIAGNOSIS — I10 Essential (primary) hypertension: Secondary | ICD-10-CM | POA: Diagnosis not present

## 2012-04-25 DIAGNOSIS — I2510 Atherosclerotic heart disease of native coronary artery without angina pectoris: Secondary | ICD-10-CM | POA: Diagnosis not present

## 2012-04-27 DIAGNOSIS — E119 Type 2 diabetes mellitus without complications: Secondary | ICD-10-CM | POA: Diagnosis not present

## 2012-04-27 DIAGNOSIS — I10 Essential (primary) hypertension: Secondary | ICD-10-CM | POA: Diagnosis not present

## 2012-04-27 DIAGNOSIS — F0390 Unspecified dementia without behavioral disturbance: Secondary | ICD-10-CM | POA: Diagnosis not present

## 2012-04-27 DIAGNOSIS — K219 Gastro-esophageal reflux disease without esophagitis: Secondary | ICD-10-CM | POA: Diagnosis not present

## 2012-04-27 DIAGNOSIS — I700 Atherosclerosis of aorta: Secondary | ICD-10-CM | POA: Diagnosis not present

## 2012-04-27 DIAGNOSIS — I2510 Atherosclerotic heart disease of native coronary artery without angina pectoris: Secondary | ICD-10-CM | POA: Diagnosis not present

## 2012-05-02 DIAGNOSIS — I2510 Atherosclerotic heart disease of native coronary artery without angina pectoris: Secondary | ICD-10-CM | POA: Diagnosis not present

## 2012-05-02 DIAGNOSIS — K219 Gastro-esophageal reflux disease without esophagitis: Secondary | ICD-10-CM | POA: Diagnosis not present

## 2012-05-02 DIAGNOSIS — F0390 Unspecified dementia without behavioral disturbance: Secondary | ICD-10-CM | POA: Diagnosis not present

## 2012-05-02 DIAGNOSIS — E119 Type 2 diabetes mellitus without complications: Secondary | ICD-10-CM | POA: Diagnosis not present

## 2012-05-02 DIAGNOSIS — I700 Atherosclerosis of aorta: Secondary | ICD-10-CM | POA: Diagnosis not present

## 2012-05-02 DIAGNOSIS — I10 Essential (primary) hypertension: Secondary | ICD-10-CM | POA: Diagnosis not present

## 2012-05-04 DIAGNOSIS — I2510 Atherosclerotic heart disease of native coronary artery without angina pectoris: Secondary | ICD-10-CM | POA: Diagnosis not present

## 2012-05-04 DIAGNOSIS — E119 Type 2 diabetes mellitus without complications: Secondary | ICD-10-CM | POA: Diagnosis not present

## 2012-05-04 DIAGNOSIS — F0390 Unspecified dementia without behavioral disturbance: Secondary | ICD-10-CM | POA: Diagnosis not present

## 2012-05-04 DIAGNOSIS — K219 Gastro-esophageal reflux disease without esophagitis: Secondary | ICD-10-CM | POA: Diagnosis not present

## 2012-05-04 DIAGNOSIS — I10 Essential (primary) hypertension: Secondary | ICD-10-CM | POA: Diagnosis not present

## 2012-05-04 DIAGNOSIS — I700 Atherosclerosis of aorta: Secondary | ICD-10-CM | POA: Diagnosis not present

## 2012-05-05 DIAGNOSIS — E119 Type 2 diabetes mellitus without complications: Secondary | ICD-10-CM | POA: Diagnosis not present

## 2012-05-05 DIAGNOSIS — K219 Gastro-esophageal reflux disease without esophagitis: Secondary | ICD-10-CM | POA: Diagnosis not present

## 2012-05-05 DIAGNOSIS — I10 Essential (primary) hypertension: Secondary | ICD-10-CM | POA: Diagnosis not present

## 2012-05-05 DIAGNOSIS — F0390 Unspecified dementia without behavioral disturbance: Secondary | ICD-10-CM | POA: Diagnosis not present

## 2012-05-05 DIAGNOSIS — I700 Atherosclerosis of aorta: Secondary | ICD-10-CM | POA: Diagnosis not present

## 2012-05-05 DIAGNOSIS — I2510 Atherosclerotic heart disease of native coronary artery without angina pectoris: Secondary | ICD-10-CM | POA: Diagnosis not present

## 2012-05-09 DIAGNOSIS — I10 Essential (primary) hypertension: Secondary | ICD-10-CM | POA: Diagnosis not present

## 2012-05-09 DIAGNOSIS — I2510 Atherosclerotic heart disease of native coronary artery without angina pectoris: Secondary | ICD-10-CM | POA: Diagnosis not present

## 2012-05-09 DIAGNOSIS — E119 Type 2 diabetes mellitus without complications: Secondary | ICD-10-CM | POA: Diagnosis not present

## 2012-05-09 DIAGNOSIS — F0390 Unspecified dementia without behavioral disturbance: Secondary | ICD-10-CM | POA: Diagnosis not present

## 2012-05-09 DIAGNOSIS — K219 Gastro-esophageal reflux disease without esophagitis: Secondary | ICD-10-CM | POA: Diagnosis not present

## 2012-05-09 DIAGNOSIS — I700 Atherosclerosis of aorta: Secondary | ICD-10-CM | POA: Diagnosis not present

## 2012-05-10 DIAGNOSIS — F0390 Unspecified dementia without behavioral disturbance: Secondary | ICD-10-CM | POA: Diagnosis not present

## 2012-05-10 DIAGNOSIS — E119 Type 2 diabetes mellitus without complications: Secondary | ICD-10-CM | POA: Diagnosis not present

## 2012-05-10 DIAGNOSIS — K219 Gastro-esophageal reflux disease without esophagitis: Secondary | ICD-10-CM | POA: Diagnosis not present

## 2012-05-10 DIAGNOSIS — I700 Atherosclerosis of aorta: Secondary | ICD-10-CM | POA: Diagnosis not present

## 2012-05-10 DIAGNOSIS — I10 Essential (primary) hypertension: Secondary | ICD-10-CM | POA: Diagnosis not present

## 2012-05-10 DIAGNOSIS — I2510 Atherosclerotic heart disease of native coronary artery without angina pectoris: Secondary | ICD-10-CM | POA: Diagnosis not present

## 2012-06-05 DIAGNOSIS — I369 Nonrheumatic tricuspid valve disorder, unspecified: Secondary | ICD-10-CM | POA: Diagnosis not present

## 2012-06-05 DIAGNOSIS — Z006 Encounter for examination for normal comparison and control in clinical research program: Secondary | ICD-10-CM | POA: Diagnosis not present

## 2012-06-05 DIAGNOSIS — Z79899 Other long term (current) drug therapy: Secondary | ICD-10-CM | POA: Diagnosis not present

## 2012-06-05 DIAGNOSIS — Z7982 Long term (current) use of aspirin: Secondary | ICD-10-CM | POA: Diagnosis not present

## 2012-06-05 DIAGNOSIS — Z954 Presence of other heart-valve replacement: Secondary | ICD-10-CM | POA: Diagnosis not present

## 2012-06-27 DIAGNOSIS — E1159 Type 2 diabetes mellitus with other circulatory complications: Secondary | ICD-10-CM | POA: Diagnosis not present

## 2012-06-27 DIAGNOSIS — E119 Type 2 diabetes mellitus without complications: Secondary | ICD-10-CM | POA: Diagnosis not present

## 2012-07-09 DIAGNOSIS — E119 Type 2 diabetes mellitus without complications: Secondary | ICD-10-CM | POA: Diagnosis not present

## 2012-07-09 DIAGNOSIS — I2510 Atherosclerotic heart disease of native coronary artery without angina pectoris: Secondary | ICD-10-CM | POA: Diagnosis not present

## 2012-07-09 DIAGNOSIS — I10 Essential (primary) hypertension: Secondary | ICD-10-CM | POA: Diagnosis not present

## 2012-07-09 DIAGNOSIS — F0390 Unspecified dementia without behavioral disturbance: Secondary | ICD-10-CM | POA: Diagnosis not present

## 2012-07-09 DIAGNOSIS — I700 Atherosclerosis of aorta: Secondary | ICD-10-CM | POA: Diagnosis not present

## 2012-07-09 DIAGNOSIS — K219 Gastro-esophageal reflux disease without esophagitis: Secondary | ICD-10-CM | POA: Diagnosis not present

## 2012-07-10 DIAGNOSIS — E782 Mixed hyperlipidemia: Secondary | ICD-10-CM | POA: Diagnosis not present

## 2012-07-10 DIAGNOSIS — M722 Plantar fascial fibromatosis: Secondary | ICD-10-CM | POA: Diagnosis not present

## 2012-07-10 DIAGNOSIS — E1310 Other specified diabetes mellitus with ketoacidosis without coma: Secondary | ICD-10-CM | POA: Diagnosis not present

## 2012-07-11 DIAGNOSIS — I2510 Atherosclerotic heart disease of native coronary artery without angina pectoris: Secondary | ICD-10-CM | POA: Diagnosis not present

## 2012-07-11 DIAGNOSIS — F0390 Unspecified dementia without behavioral disturbance: Secondary | ICD-10-CM | POA: Diagnosis not present

## 2012-07-11 DIAGNOSIS — K219 Gastro-esophageal reflux disease without esophagitis: Secondary | ICD-10-CM | POA: Diagnosis not present

## 2012-07-11 DIAGNOSIS — I10 Essential (primary) hypertension: Secondary | ICD-10-CM | POA: Diagnosis not present

## 2012-07-11 DIAGNOSIS — I700 Atherosclerosis of aorta: Secondary | ICD-10-CM | POA: Diagnosis not present

## 2012-07-11 DIAGNOSIS — E119 Type 2 diabetes mellitus without complications: Secondary | ICD-10-CM | POA: Diagnosis not present

## 2012-07-14 DIAGNOSIS — I700 Atherosclerosis of aorta: Secondary | ICD-10-CM | POA: Diagnosis not present

## 2012-07-14 DIAGNOSIS — I10 Essential (primary) hypertension: Secondary | ICD-10-CM | POA: Diagnosis not present

## 2012-07-14 DIAGNOSIS — F0390 Unspecified dementia without behavioral disturbance: Secondary | ICD-10-CM | POA: Diagnosis not present

## 2012-07-14 DIAGNOSIS — E119 Type 2 diabetes mellitus without complications: Secondary | ICD-10-CM | POA: Diagnosis not present

## 2012-07-14 DIAGNOSIS — I2510 Atherosclerotic heart disease of native coronary artery without angina pectoris: Secondary | ICD-10-CM | POA: Diagnosis not present

## 2012-07-14 DIAGNOSIS — K219 Gastro-esophageal reflux disease without esophagitis: Secondary | ICD-10-CM | POA: Diagnosis not present

## 2012-07-18 DIAGNOSIS — E119 Type 2 diabetes mellitus without complications: Secondary | ICD-10-CM | POA: Diagnosis not present

## 2012-07-18 DIAGNOSIS — F0390 Unspecified dementia without behavioral disturbance: Secondary | ICD-10-CM | POA: Diagnosis not present

## 2012-07-18 DIAGNOSIS — I10 Essential (primary) hypertension: Secondary | ICD-10-CM | POA: Diagnosis not present

## 2012-07-18 DIAGNOSIS — I2510 Atherosclerotic heart disease of native coronary artery without angina pectoris: Secondary | ICD-10-CM | POA: Diagnosis not present

## 2012-07-18 DIAGNOSIS — I700 Atherosclerosis of aorta: Secondary | ICD-10-CM | POA: Diagnosis not present

## 2012-07-18 DIAGNOSIS — K219 Gastro-esophageal reflux disease without esophagitis: Secondary | ICD-10-CM | POA: Diagnosis not present

## 2012-07-21 DIAGNOSIS — I10 Essential (primary) hypertension: Secondary | ICD-10-CM | POA: Diagnosis not present

## 2012-07-21 DIAGNOSIS — I700 Atherosclerosis of aorta: Secondary | ICD-10-CM | POA: Diagnosis not present

## 2012-07-21 DIAGNOSIS — F0390 Unspecified dementia without behavioral disturbance: Secondary | ICD-10-CM | POA: Diagnosis not present

## 2012-07-21 DIAGNOSIS — E119 Type 2 diabetes mellitus without complications: Secondary | ICD-10-CM | POA: Diagnosis not present

## 2012-07-21 DIAGNOSIS — I2510 Atherosclerotic heart disease of native coronary artery without angina pectoris: Secondary | ICD-10-CM | POA: Diagnosis not present

## 2012-07-21 DIAGNOSIS — K219 Gastro-esophageal reflux disease without esophagitis: Secondary | ICD-10-CM | POA: Diagnosis not present

## 2012-07-25 DIAGNOSIS — I700 Atherosclerosis of aorta: Secondary | ICD-10-CM | POA: Diagnosis not present

## 2012-07-25 DIAGNOSIS — K219 Gastro-esophageal reflux disease without esophagitis: Secondary | ICD-10-CM | POA: Diagnosis not present

## 2012-07-25 DIAGNOSIS — I2510 Atherosclerotic heart disease of native coronary artery without angina pectoris: Secondary | ICD-10-CM | POA: Diagnosis not present

## 2012-07-25 DIAGNOSIS — E119 Type 2 diabetes mellitus without complications: Secondary | ICD-10-CM | POA: Diagnosis not present

## 2012-07-25 DIAGNOSIS — I10 Essential (primary) hypertension: Secondary | ICD-10-CM | POA: Diagnosis not present

## 2012-07-25 DIAGNOSIS — F0390 Unspecified dementia without behavioral disturbance: Secondary | ICD-10-CM | POA: Diagnosis not present

## 2012-07-28 DIAGNOSIS — E119 Type 2 diabetes mellitus without complications: Secondary | ICD-10-CM | POA: Diagnosis not present

## 2012-07-28 DIAGNOSIS — I10 Essential (primary) hypertension: Secondary | ICD-10-CM | POA: Diagnosis not present

## 2012-07-28 DIAGNOSIS — I2510 Atherosclerotic heart disease of native coronary artery without angina pectoris: Secondary | ICD-10-CM | POA: Diagnosis not present

## 2012-07-28 DIAGNOSIS — I700 Atherosclerosis of aorta: Secondary | ICD-10-CM | POA: Diagnosis not present

## 2012-07-28 DIAGNOSIS — F0390 Unspecified dementia without behavioral disturbance: Secondary | ICD-10-CM | POA: Diagnosis not present

## 2012-07-28 DIAGNOSIS — K219 Gastro-esophageal reflux disease without esophagitis: Secondary | ICD-10-CM | POA: Diagnosis not present

## 2012-08-01 DIAGNOSIS — I10 Essential (primary) hypertension: Secondary | ICD-10-CM | POA: Diagnosis not present

## 2012-08-01 DIAGNOSIS — E119 Type 2 diabetes mellitus without complications: Secondary | ICD-10-CM | POA: Diagnosis not present

## 2012-08-01 DIAGNOSIS — I700 Atherosclerosis of aorta: Secondary | ICD-10-CM | POA: Diagnosis not present

## 2012-08-01 DIAGNOSIS — K219 Gastro-esophageal reflux disease without esophagitis: Secondary | ICD-10-CM | POA: Diagnosis not present

## 2012-08-01 DIAGNOSIS — I2510 Atherosclerotic heart disease of native coronary artery without angina pectoris: Secondary | ICD-10-CM | POA: Diagnosis not present

## 2012-08-01 DIAGNOSIS — F0390 Unspecified dementia without behavioral disturbance: Secondary | ICD-10-CM | POA: Diagnosis not present

## 2012-08-03 DIAGNOSIS — K219 Gastro-esophageal reflux disease without esophagitis: Secondary | ICD-10-CM | POA: Diagnosis not present

## 2012-08-03 DIAGNOSIS — I2510 Atherosclerotic heart disease of native coronary artery without angina pectoris: Secondary | ICD-10-CM | POA: Diagnosis not present

## 2012-08-03 DIAGNOSIS — E119 Type 2 diabetes mellitus without complications: Secondary | ICD-10-CM | POA: Diagnosis not present

## 2012-08-03 DIAGNOSIS — I10 Essential (primary) hypertension: Secondary | ICD-10-CM | POA: Diagnosis not present

## 2012-08-03 DIAGNOSIS — F0390 Unspecified dementia without behavioral disturbance: Secondary | ICD-10-CM | POA: Diagnosis not present

## 2012-08-03 DIAGNOSIS — I700 Atherosclerosis of aorta: Secondary | ICD-10-CM | POA: Diagnosis not present

## 2012-08-08 DIAGNOSIS — I700 Atherosclerosis of aorta: Secondary | ICD-10-CM | POA: Diagnosis not present

## 2012-08-08 DIAGNOSIS — I2510 Atherosclerotic heart disease of native coronary artery without angina pectoris: Secondary | ICD-10-CM | POA: Diagnosis not present

## 2012-08-08 DIAGNOSIS — E119 Type 2 diabetes mellitus without complications: Secondary | ICD-10-CM | POA: Diagnosis not present

## 2012-08-08 DIAGNOSIS — K219 Gastro-esophageal reflux disease without esophagitis: Secondary | ICD-10-CM | POA: Diagnosis not present

## 2012-08-08 DIAGNOSIS — I10 Essential (primary) hypertension: Secondary | ICD-10-CM | POA: Diagnosis not present

## 2012-08-08 DIAGNOSIS — F0390 Unspecified dementia without behavioral disturbance: Secondary | ICD-10-CM | POA: Diagnosis not present

## 2012-08-11 DIAGNOSIS — I2510 Atherosclerotic heart disease of native coronary artery without angina pectoris: Secondary | ICD-10-CM | POA: Diagnosis not present

## 2012-08-11 DIAGNOSIS — I10 Essential (primary) hypertension: Secondary | ICD-10-CM | POA: Diagnosis not present

## 2012-08-11 DIAGNOSIS — K219 Gastro-esophageal reflux disease without esophagitis: Secondary | ICD-10-CM | POA: Diagnosis not present

## 2012-08-11 DIAGNOSIS — I700 Atherosclerosis of aorta: Secondary | ICD-10-CM | POA: Diagnosis not present

## 2012-08-11 DIAGNOSIS — F0390 Unspecified dementia without behavioral disturbance: Secondary | ICD-10-CM | POA: Diagnosis not present

## 2012-08-11 DIAGNOSIS — E119 Type 2 diabetes mellitus without complications: Secondary | ICD-10-CM | POA: Diagnosis not present

## 2012-08-15 DIAGNOSIS — E119 Type 2 diabetes mellitus without complications: Secondary | ICD-10-CM | POA: Diagnosis not present

## 2012-08-15 DIAGNOSIS — I2510 Atherosclerotic heart disease of native coronary artery without angina pectoris: Secondary | ICD-10-CM | POA: Diagnosis not present

## 2012-08-15 DIAGNOSIS — I10 Essential (primary) hypertension: Secondary | ICD-10-CM | POA: Diagnosis not present

## 2012-08-15 DIAGNOSIS — K219 Gastro-esophageal reflux disease without esophagitis: Secondary | ICD-10-CM | POA: Diagnosis not present

## 2012-08-15 DIAGNOSIS — I700 Atherosclerosis of aorta: Secondary | ICD-10-CM | POA: Diagnosis not present

## 2012-08-15 DIAGNOSIS — F0390 Unspecified dementia without behavioral disturbance: Secondary | ICD-10-CM | POA: Diagnosis not present

## 2012-08-18 DIAGNOSIS — I700 Atherosclerosis of aorta: Secondary | ICD-10-CM | POA: Diagnosis not present

## 2012-08-18 DIAGNOSIS — I2510 Atherosclerotic heart disease of native coronary artery without angina pectoris: Secondary | ICD-10-CM | POA: Diagnosis not present

## 2012-08-18 DIAGNOSIS — E119 Type 2 diabetes mellitus without complications: Secondary | ICD-10-CM | POA: Diagnosis not present

## 2012-08-18 DIAGNOSIS — F0390 Unspecified dementia without behavioral disturbance: Secondary | ICD-10-CM | POA: Diagnosis not present

## 2012-08-18 DIAGNOSIS — I10 Essential (primary) hypertension: Secondary | ICD-10-CM | POA: Diagnosis not present

## 2012-08-18 DIAGNOSIS — K219 Gastro-esophageal reflux disease without esophagitis: Secondary | ICD-10-CM | POA: Diagnosis not present

## 2012-08-22 DIAGNOSIS — I2510 Atherosclerotic heart disease of native coronary artery without angina pectoris: Secondary | ICD-10-CM | POA: Diagnosis not present

## 2012-08-22 DIAGNOSIS — I700 Atherosclerosis of aorta: Secondary | ICD-10-CM | POA: Diagnosis not present

## 2012-08-22 DIAGNOSIS — K219 Gastro-esophageal reflux disease without esophagitis: Secondary | ICD-10-CM | POA: Diagnosis not present

## 2012-08-22 DIAGNOSIS — I10 Essential (primary) hypertension: Secondary | ICD-10-CM | POA: Diagnosis not present

## 2012-08-22 DIAGNOSIS — F0390 Unspecified dementia without behavioral disturbance: Secondary | ICD-10-CM | POA: Diagnosis not present

## 2012-08-22 DIAGNOSIS — E119 Type 2 diabetes mellitus without complications: Secondary | ICD-10-CM | POA: Diagnosis not present

## 2012-08-23 DIAGNOSIS — H26499 Other secondary cataract, unspecified eye: Secondary | ICD-10-CM | POA: Diagnosis not present

## 2012-08-23 DIAGNOSIS — H43399 Other vitreous opacities, unspecified eye: Secondary | ICD-10-CM | POA: Diagnosis not present

## 2012-08-25 DIAGNOSIS — I10 Essential (primary) hypertension: Secondary | ICD-10-CM | POA: Diagnosis not present

## 2012-08-25 DIAGNOSIS — E119 Type 2 diabetes mellitus without complications: Secondary | ICD-10-CM | POA: Diagnosis not present

## 2012-08-25 DIAGNOSIS — K219 Gastro-esophageal reflux disease without esophagitis: Secondary | ICD-10-CM | POA: Diagnosis not present

## 2012-08-25 DIAGNOSIS — I2510 Atherosclerotic heart disease of native coronary artery without angina pectoris: Secondary | ICD-10-CM | POA: Diagnosis not present

## 2012-08-25 DIAGNOSIS — F0390 Unspecified dementia without behavioral disturbance: Secondary | ICD-10-CM | POA: Diagnosis not present

## 2012-08-25 DIAGNOSIS — I700 Atherosclerosis of aorta: Secondary | ICD-10-CM | POA: Diagnosis not present

## 2012-08-29 DIAGNOSIS — K219 Gastro-esophageal reflux disease without esophagitis: Secondary | ICD-10-CM | POA: Diagnosis not present

## 2012-08-29 DIAGNOSIS — I700 Atherosclerosis of aorta: Secondary | ICD-10-CM | POA: Diagnosis not present

## 2012-08-29 DIAGNOSIS — I2510 Atherosclerotic heart disease of native coronary artery without angina pectoris: Secondary | ICD-10-CM | POA: Diagnosis not present

## 2012-08-29 DIAGNOSIS — I10 Essential (primary) hypertension: Secondary | ICD-10-CM | POA: Diagnosis not present

## 2012-08-29 DIAGNOSIS — E119 Type 2 diabetes mellitus without complications: Secondary | ICD-10-CM | POA: Diagnosis not present

## 2012-08-29 DIAGNOSIS — F0390 Unspecified dementia without behavioral disturbance: Secondary | ICD-10-CM | POA: Diagnosis not present

## 2012-09-01 DIAGNOSIS — K219 Gastro-esophageal reflux disease without esophagitis: Secondary | ICD-10-CM | POA: Diagnosis not present

## 2012-09-01 DIAGNOSIS — I700 Atherosclerosis of aorta: Secondary | ICD-10-CM | POA: Diagnosis not present

## 2012-09-01 DIAGNOSIS — F0390 Unspecified dementia without behavioral disturbance: Secondary | ICD-10-CM | POA: Diagnosis not present

## 2012-09-01 DIAGNOSIS — Z23 Encounter for immunization: Secondary | ICD-10-CM | POA: Diagnosis not present

## 2012-09-01 DIAGNOSIS — I10 Essential (primary) hypertension: Secondary | ICD-10-CM | POA: Diagnosis not present

## 2012-09-01 DIAGNOSIS — E119 Type 2 diabetes mellitus without complications: Secondary | ICD-10-CM | POA: Diagnosis not present

## 2012-09-01 DIAGNOSIS — I2510 Atherosclerotic heart disease of native coronary artery without angina pectoris: Secondary | ICD-10-CM | POA: Diagnosis not present

## 2012-09-05 DIAGNOSIS — M25579 Pain in unspecified ankle and joints of unspecified foot: Secondary | ICD-10-CM | POA: Diagnosis not present

## 2012-09-05 DIAGNOSIS — K219 Gastro-esophageal reflux disease without esophagitis: Secondary | ICD-10-CM | POA: Diagnosis not present

## 2012-09-05 DIAGNOSIS — I2510 Atherosclerotic heart disease of native coronary artery without angina pectoris: Secondary | ICD-10-CM | POA: Diagnosis not present

## 2012-09-05 DIAGNOSIS — E119 Type 2 diabetes mellitus without complications: Secondary | ICD-10-CM | POA: Diagnosis not present

## 2012-09-05 DIAGNOSIS — I10 Essential (primary) hypertension: Secondary | ICD-10-CM | POA: Diagnosis not present

## 2012-09-05 DIAGNOSIS — I700 Atherosclerosis of aorta: Secondary | ICD-10-CM | POA: Diagnosis not present

## 2012-09-05 DIAGNOSIS — F0390 Unspecified dementia without behavioral disturbance: Secondary | ICD-10-CM | POA: Diagnosis not present

## 2012-09-05 DIAGNOSIS — M79609 Pain in unspecified limb: Secondary | ICD-10-CM | POA: Diagnosis not present

## 2012-09-07 DIAGNOSIS — K219 Gastro-esophageal reflux disease without esophagitis: Secondary | ICD-10-CM | POA: Diagnosis not present

## 2012-09-07 DIAGNOSIS — F0390 Unspecified dementia without behavioral disturbance: Secondary | ICD-10-CM | POA: Diagnosis not present

## 2012-09-07 DIAGNOSIS — E119 Type 2 diabetes mellitus without complications: Secondary | ICD-10-CM | POA: Diagnosis not present

## 2012-09-07 DIAGNOSIS — I700 Atherosclerosis of aorta: Secondary | ICD-10-CM | POA: Diagnosis not present

## 2012-09-07 DIAGNOSIS — I2510 Atherosclerotic heart disease of native coronary artery without angina pectoris: Secondary | ICD-10-CM | POA: Diagnosis not present

## 2012-09-07 DIAGNOSIS — I10 Essential (primary) hypertension: Secondary | ICD-10-CM | POA: Diagnosis not present

## 2012-09-12 DIAGNOSIS — E1159 Type 2 diabetes mellitus with other circulatory complications: Secondary | ICD-10-CM | POA: Diagnosis not present

## 2012-09-12 DIAGNOSIS — E119 Type 2 diabetes mellitus without complications: Secondary | ICD-10-CM | POA: Diagnosis not present

## 2012-10-09 DIAGNOSIS — H547 Unspecified visual loss: Secondary | ICD-10-CM | POA: Diagnosis not present

## 2012-10-09 DIAGNOSIS — H40019 Open angle with borderline findings, low risk, unspecified eye: Secondary | ICD-10-CM | POA: Diagnosis not present

## 2012-10-09 DIAGNOSIS — H1045 Other chronic allergic conjunctivitis: Secondary | ICD-10-CM | POA: Diagnosis not present

## 2012-10-09 DIAGNOSIS — E1139 Type 2 diabetes mellitus with other diabetic ophthalmic complication: Secondary | ICD-10-CM | POA: Diagnosis not present

## 2012-10-13 DIAGNOSIS — I10 Essential (primary) hypertension: Secondary | ICD-10-CM | POA: Diagnosis not present

## 2012-11-06 DIAGNOSIS — E119 Type 2 diabetes mellitus without complications: Secondary | ICD-10-CM | POA: Diagnosis not present

## 2012-11-06 DIAGNOSIS — I10 Essential (primary) hypertension: Secondary | ICD-10-CM | POA: Diagnosis not present

## 2012-11-06 DIAGNOSIS — K219 Gastro-esophageal reflux disease without esophagitis: Secondary | ICD-10-CM | POA: Diagnosis not present

## 2012-11-06 DIAGNOSIS — I2510 Atherosclerotic heart disease of native coronary artery without angina pectoris: Secondary | ICD-10-CM | POA: Diagnosis not present

## 2012-11-06 DIAGNOSIS — I700 Atherosclerosis of aorta: Secondary | ICD-10-CM | POA: Diagnosis not present

## 2012-11-06 DIAGNOSIS — F0390 Unspecified dementia without behavioral disturbance: Secondary | ICD-10-CM | POA: Diagnosis not present

## 2012-11-07 DIAGNOSIS — I2510 Atherosclerotic heart disease of native coronary artery without angina pectoris: Secondary | ICD-10-CM | POA: Diagnosis not present

## 2012-11-07 DIAGNOSIS — K219 Gastro-esophageal reflux disease without esophagitis: Secondary | ICD-10-CM | POA: Diagnosis not present

## 2012-11-07 DIAGNOSIS — E119 Type 2 diabetes mellitus without complications: Secondary | ICD-10-CM | POA: Diagnosis not present

## 2012-11-07 DIAGNOSIS — I700 Atherosclerosis of aorta: Secondary | ICD-10-CM | POA: Diagnosis not present

## 2012-11-07 DIAGNOSIS — F0390 Unspecified dementia without behavioral disturbance: Secondary | ICD-10-CM | POA: Diagnosis not present

## 2012-11-07 DIAGNOSIS — I10 Essential (primary) hypertension: Secondary | ICD-10-CM | POA: Diagnosis not present

## 2012-11-09 DIAGNOSIS — E1139 Type 2 diabetes mellitus with other diabetic ophthalmic complication: Secondary | ICD-10-CM | POA: Diagnosis not present

## 2012-11-09 DIAGNOSIS — E11319 Type 2 diabetes mellitus with unspecified diabetic retinopathy without macular edema: Secondary | ICD-10-CM | POA: Diagnosis not present

## 2012-11-10 DIAGNOSIS — I6529 Occlusion and stenosis of unspecified carotid artery: Secondary | ICD-10-CM | POA: Diagnosis not present

## 2012-11-10 DIAGNOSIS — F0390 Unspecified dementia without behavioral disturbance: Secondary | ICD-10-CM | POA: Diagnosis not present

## 2012-11-10 DIAGNOSIS — I658 Occlusion and stenosis of other precerebral arteries: Secondary | ICD-10-CM | POA: Diagnosis not present

## 2012-11-10 DIAGNOSIS — I10 Essential (primary) hypertension: Secondary | ICD-10-CM | POA: Diagnosis not present

## 2012-11-10 DIAGNOSIS — K219 Gastro-esophageal reflux disease without esophagitis: Secondary | ICD-10-CM | POA: Diagnosis not present

## 2012-11-10 DIAGNOSIS — H543 Unqualified visual loss, both eyes: Secondary | ICD-10-CM | POA: Diagnosis not present

## 2012-11-10 DIAGNOSIS — I700 Atherosclerosis of aorta: Secondary | ICD-10-CM | POA: Diagnosis not present

## 2012-11-10 DIAGNOSIS — E119 Type 2 diabetes mellitus without complications: Secondary | ICD-10-CM | POA: Diagnosis not present

## 2012-11-10 DIAGNOSIS — I2510 Atherosclerotic heart disease of native coronary artery without angina pectoris: Secondary | ICD-10-CM | POA: Diagnosis not present

## 2012-11-10 DIAGNOSIS — R42 Dizziness and giddiness: Secondary | ICD-10-CM | POA: Diagnosis not present

## 2012-11-14 DIAGNOSIS — F0390 Unspecified dementia without behavioral disturbance: Secondary | ICD-10-CM | POA: Diagnosis not present

## 2012-11-14 DIAGNOSIS — I2510 Atherosclerotic heart disease of native coronary artery without angina pectoris: Secondary | ICD-10-CM | POA: Diagnosis not present

## 2012-11-14 DIAGNOSIS — I10 Essential (primary) hypertension: Secondary | ICD-10-CM | POA: Diagnosis not present

## 2012-11-14 DIAGNOSIS — E119 Type 2 diabetes mellitus without complications: Secondary | ICD-10-CM | POA: Diagnosis not present

## 2012-11-14 DIAGNOSIS — K219 Gastro-esophageal reflux disease without esophagitis: Secondary | ICD-10-CM | POA: Diagnosis not present

## 2012-11-14 DIAGNOSIS — I700 Atherosclerosis of aorta: Secondary | ICD-10-CM | POA: Diagnosis not present

## 2012-11-17 DIAGNOSIS — K219 Gastro-esophageal reflux disease without esophagitis: Secondary | ICD-10-CM | POA: Diagnosis not present

## 2012-11-17 DIAGNOSIS — E119 Type 2 diabetes mellitus without complications: Secondary | ICD-10-CM | POA: Diagnosis not present

## 2012-11-17 DIAGNOSIS — I700 Atherosclerosis of aorta: Secondary | ICD-10-CM | POA: Diagnosis not present

## 2012-11-17 DIAGNOSIS — I10 Essential (primary) hypertension: Secondary | ICD-10-CM | POA: Diagnosis not present

## 2012-11-17 DIAGNOSIS — I2510 Atherosclerotic heart disease of native coronary artery without angina pectoris: Secondary | ICD-10-CM | POA: Diagnosis not present

## 2012-11-17 DIAGNOSIS — F0390 Unspecified dementia without behavioral disturbance: Secondary | ICD-10-CM | POA: Diagnosis not present

## 2012-11-21 DIAGNOSIS — K219 Gastro-esophageal reflux disease without esophagitis: Secondary | ICD-10-CM | POA: Diagnosis not present

## 2012-11-21 DIAGNOSIS — I2510 Atherosclerotic heart disease of native coronary artery without angina pectoris: Secondary | ICD-10-CM | POA: Diagnosis not present

## 2012-11-21 DIAGNOSIS — E119 Type 2 diabetes mellitus without complications: Secondary | ICD-10-CM | POA: Diagnosis not present

## 2012-11-21 DIAGNOSIS — I10 Essential (primary) hypertension: Secondary | ICD-10-CM | POA: Diagnosis not present

## 2012-11-21 DIAGNOSIS — I700 Atherosclerosis of aorta: Secondary | ICD-10-CM | POA: Diagnosis not present

## 2012-11-21 DIAGNOSIS — F0390 Unspecified dementia without behavioral disturbance: Secondary | ICD-10-CM | POA: Diagnosis not present

## 2012-11-23 DIAGNOSIS — IMO0002 Reserved for concepts with insufficient information to code with codable children: Secondary | ICD-10-CM | POA: Diagnosis not present

## 2012-11-23 DIAGNOSIS — Z23 Encounter for immunization: Secondary | ICD-10-CM | POA: Diagnosis not present

## 2012-11-23 DIAGNOSIS — Z954 Presence of other heart-valve replacement: Secondary | ICD-10-CM | POA: Diagnosis not present

## 2012-11-23 DIAGNOSIS — C9200 Acute myeloblastic leukemia, not having achieved remission: Secondary | ICD-10-CM | POA: Diagnosis not present

## 2012-11-23 DIAGNOSIS — R5383 Other fatigue: Secondary | ICD-10-CM | POA: Diagnosis not present

## 2012-11-23 DIAGNOSIS — G8929 Other chronic pain: Secondary | ICD-10-CM | POA: Diagnosis present

## 2012-11-23 DIAGNOSIS — Z79899 Other long term (current) drug therapy: Secondary | ICD-10-CM | POA: Diagnosis not present

## 2012-11-23 DIAGNOSIS — R262 Difficulty in walking, not elsewhere classified: Secondary | ICD-10-CM | POA: Diagnosis not present

## 2012-11-23 DIAGNOSIS — F05 Delirium due to known physiological condition: Secondary | ICD-10-CM | POA: Diagnosis not present

## 2012-11-23 DIAGNOSIS — M6281 Muscle weakness (generalized): Secondary | ICD-10-CM | POA: Diagnosis not present

## 2012-11-23 DIAGNOSIS — E119 Type 2 diabetes mellitus without complications: Secondary | ICD-10-CM | POA: Diagnosis not present

## 2012-11-23 DIAGNOSIS — D649 Anemia, unspecified: Secondary | ICD-10-CM | POA: Diagnosis not present

## 2012-11-23 DIAGNOSIS — Z791 Long term (current) use of non-steroidal anti-inflammatories (NSAID): Secondary | ICD-10-CM | POA: Diagnosis not present

## 2012-11-23 DIAGNOSIS — I452 Bifascicular block: Secondary | ICD-10-CM | POA: Diagnosis not present

## 2012-11-23 DIAGNOSIS — W19XXXA Unspecified fall, initial encounter: Secondary | ICD-10-CM | POA: Diagnosis not present

## 2012-11-23 DIAGNOSIS — Y92009 Unspecified place in unspecified non-institutional (private) residence as the place of occurrence of the external cause: Secondary | ICD-10-CM | POA: Diagnosis not present

## 2012-11-23 DIAGNOSIS — S72009A Fracture of unspecified part of neck of unspecified femur, initial encounter for closed fracture: Secondary | ICD-10-CM | POA: Diagnosis not present

## 2012-11-23 DIAGNOSIS — M549 Dorsalgia, unspecified: Secondary | ICD-10-CM | POA: Diagnosis present

## 2012-11-23 DIAGNOSIS — Z9981 Dependence on supplemental oxygen: Secondary | ICD-10-CM | POA: Diagnosis not present

## 2012-11-23 DIAGNOSIS — E785 Hyperlipidemia, unspecified: Secondary | ICD-10-CM | POA: Diagnosis present

## 2012-11-23 DIAGNOSIS — D696 Thrombocytopenia, unspecified: Secondary | ICD-10-CM | POA: Diagnosis not present

## 2012-11-23 DIAGNOSIS — I2510 Atherosclerotic heart disease of native coronary artery without angina pectoris: Secondary | ICD-10-CM | POA: Diagnosis not present

## 2012-11-23 DIAGNOSIS — I509 Heart failure, unspecified: Secondary | ICD-10-CM | POA: Diagnosis not present

## 2012-11-23 DIAGNOSIS — M159 Polyosteoarthritis, unspecified: Secondary | ICD-10-CM | POA: Diagnosis present

## 2012-11-23 DIAGNOSIS — I5032 Chronic diastolic (congestive) heart failure: Secondary | ICD-10-CM | POA: Diagnosis not present

## 2012-11-23 DIAGNOSIS — Z9889 Other specified postprocedural states: Secondary | ICD-10-CM | POA: Diagnosis not present

## 2012-11-23 DIAGNOSIS — D62 Acute posthemorrhagic anemia: Secondary | ICD-10-CM | POA: Diagnosis not present

## 2012-11-23 DIAGNOSIS — I10 Essential (primary) hypertension: Secondary | ICD-10-CM | POA: Diagnosis not present

## 2012-11-23 DIAGNOSIS — Z951 Presence of aortocoronary bypass graft: Secondary | ICD-10-CM | POA: Diagnosis not present

## 2012-11-23 DIAGNOSIS — Z96659 Presence of unspecified artificial knee joint: Secondary | ICD-10-CM | POA: Diagnosis not present

## 2012-11-23 DIAGNOSIS — Z01818 Encounter for other preprocedural examination: Secondary | ICD-10-CM | POA: Diagnosis not present

## 2012-11-23 DIAGNOSIS — S72009D Fracture of unspecified part of neck of unspecified femur, subsequent encounter for closed fracture with routine healing: Secondary | ICD-10-CM | POA: Diagnosis not present

## 2012-11-23 DIAGNOSIS — Z7401 Bed confinement status: Secondary | ICD-10-CM | POA: Diagnosis not present

## 2012-11-27 DIAGNOSIS — Z9981 Dependence on supplemental oxygen: Secondary | ICD-10-CM | POA: Diagnosis not present

## 2012-11-27 DIAGNOSIS — IMO0002 Reserved for concepts with insufficient information to code with codable children: Secondary | ICD-10-CM | POA: Diagnosis not present

## 2012-11-27 DIAGNOSIS — D62 Acute posthemorrhagic anemia: Secondary | ICD-10-CM | POA: Diagnosis not present

## 2012-11-27 DIAGNOSIS — E785 Hyperlipidemia, unspecified: Secondary | ICD-10-CM | POA: Diagnosis not present

## 2012-11-27 DIAGNOSIS — M6281 Muscle weakness (generalized): Secondary | ICD-10-CM | POA: Diagnosis not present

## 2012-11-27 DIAGNOSIS — I2510 Atherosclerotic heart disease of native coronary artery without angina pectoris: Secondary | ICD-10-CM | POA: Diagnosis not present

## 2012-11-27 DIAGNOSIS — R262 Difficulty in walking, not elsewhere classified: Secondary | ICD-10-CM | POA: Diagnosis not present

## 2012-11-27 DIAGNOSIS — Y92009 Unspecified place in unspecified non-institutional (private) residence as the place of occurrence of the external cause: Secondary | ICD-10-CM | POA: Diagnosis not present

## 2012-11-27 DIAGNOSIS — W19XXXA Unspecified fall, initial encounter: Secondary | ICD-10-CM | POA: Diagnosis not present

## 2012-11-27 DIAGNOSIS — S72009A Fracture of unspecified part of neck of unspecified femur, initial encounter for closed fracture: Secondary | ICD-10-CM | POA: Diagnosis not present

## 2012-11-27 DIAGNOSIS — Z23 Encounter for immunization: Secondary | ICD-10-CM | POA: Diagnosis not present

## 2012-11-27 DIAGNOSIS — Z7401 Bed confinement status: Secondary | ICD-10-CM | POA: Diagnosis not present

## 2012-11-27 DIAGNOSIS — F05 Delirium due to known physiological condition: Secondary | ICD-10-CM | POA: Diagnosis not present

## 2012-11-27 DIAGNOSIS — C9200 Acute myeloblastic leukemia, not having achieved remission: Secondary | ICD-10-CM | POA: Diagnosis not present

## 2012-11-27 DIAGNOSIS — E119 Type 2 diabetes mellitus without complications: Secondary | ICD-10-CM | POA: Diagnosis not present

## 2012-11-27 DIAGNOSIS — I5032 Chronic diastolic (congestive) heart failure: Secondary | ICD-10-CM | POA: Diagnosis not present

## 2012-11-27 DIAGNOSIS — S72009D Fracture of unspecified part of neck of unspecified femur, subsequent encounter for closed fracture with routine healing: Secondary | ICD-10-CM | POA: Diagnosis not present

## 2012-11-27 DIAGNOSIS — I10 Essential (primary) hypertension: Secondary | ICD-10-CM | POA: Diagnosis not present

## 2013-01-03 DIAGNOSIS — S72009A Fracture of unspecified part of neck of unspecified femur, initial encounter for closed fracture: Secondary | ICD-10-CM | POA: Diagnosis not present

## 2013-01-24 DIAGNOSIS — S72009D Fracture of unspecified part of neck of unspecified femur, subsequent encounter for closed fracture with routine healing: Secondary | ICD-10-CM | POA: Diagnosis not present

## 2013-01-24 DIAGNOSIS — I10 Essential (primary) hypertension: Secondary | ICD-10-CM | POA: Diagnosis not present

## 2013-01-25 DIAGNOSIS — M6281 Muscle weakness (generalized): Secondary | ICD-10-CM | POA: Diagnosis not present

## 2013-01-29 DIAGNOSIS — M6281 Muscle weakness (generalized): Secondary | ICD-10-CM | POA: Diagnosis not present

## 2013-01-29 DIAGNOSIS — I2510 Atherosclerotic heart disease of native coronary artery without angina pectoris: Secondary | ICD-10-CM | POA: Diagnosis not present

## 2013-01-29 DIAGNOSIS — I10 Essential (primary) hypertension: Secondary | ICD-10-CM | POA: Diagnosis not present

## 2013-01-29 DIAGNOSIS — E119 Type 2 diabetes mellitus without complications: Secondary | ICD-10-CM | POA: Diagnosis not present

## 2013-01-29 DIAGNOSIS — F0390 Unspecified dementia without behavioral disturbance: Secondary | ICD-10-CM | POA: Diagnosis not present

## 2013-01-30 DIAGNOSIS — I10 Essential (primary) hypertension: Secondary | ICD-10-CM | POA: Diagnosis not present

## 2013-02-01 DIAGNOSIS — M6281 Muscle weakness (generalized): Secondary | ICD-10-CM | POA: Diagnosis not present

## 2013-02-02 DIAGNOSIS — M6281 Muscle weakness (generalized): Secondary | ICD-10-CM | POA: Diagnosis not present

## 2013-02-05 DIAGNOSIS — M6281 Muscle weakness (generalized): Secondary | ICD-10-CM | POA: Diagnosis not present

## 2013-02-09 DIAGNOSIS — S72009D Fracture of unspecified part of neck of unspecified femur, subsequent encounter for closed fracture with routine healing: Secondary | ICD-10-CM | POA: Diagnosis not present

## 2013-02-09 DIAGNOSIS — I2510 Atherosclerotic heart disease of native coronary artery without angina pectoris: Secondary | ICD-10-CM | POA: Diagnosis not present

## 2013-02-09 DIAGNOSIS — I10 Essential (primary) hypertension: Secondary | ICD-10-CM | POA: Diagnosis not present

## 2013-02-09 DIAGNOSIS — M6281 Muscle weakness (generalized): Secondary | ICD-10-CM | POA: Diagnosis not present

## 2013-02-09 DIAGNOSIS — F0390 Unspecified dementia without behavioral disturbance: Secondary | ICD-10-CM | POA: Diagnosis not present

## 2013-02-09 DIAGNOSIS — E119 Type 2 diabetes mellitus without complications: Secondary | ICD-10-CM | POA: Diagnosis not present

## 2013-02-12 DIAGNOSIS — E119 Type 2 diabetes mellitus without complications: Secondary | ICD-10-CM | POA: Diagnosis not present

## 2013-02-12 DIAGNOSIS — M6281 Muscle weakness (generalized): Secondary | ICD-10-CM | POA: Diagnosis not present

## 2013-02-12 DIAGNOSIS — I2510 Atherosclerotic heart disease of native coronary artery without angina pectoris: Secondary | ICD-10-CM | POA: Diagnosis not present

## 2013-02-12 DIAGNOSIS — F0390 Unspecified dementia without behavioral disturbance: Secondary | ICD-10-CM | POA: Diagnosis not present

## 2013-02-12 DIAGNOSIS — I10 Essential (primary) hypertension: Secondary | ICD-10-CM | POA: Diagnosis not present

## 2013-02-12 DIAGNOSIS — S72009D Fracture of unspecified part of neck of unspecified femur, subsequent encounter for closed fracture with routine healing: Secondary | ICD-10-CM | POA: Diagnosis not present

## 2013-02-13 DIAGNOSIS — E119 Type 2 diabetes mellitus without complications: Secondary | ICD-10-CM | POA: Diagnosis not present

## 2013-02-13 DIAGNOSIS — F0390 Unspecified dementia without behavioral disturbance: Secondary | ICD-10-CM | POA: Diagnosis not present

## 2013-02-13 DIAGNOSIS — E1159 Type 2 diabetes mellitus with other circulatory complications: Secondary | ICD-10-CM | POA: Diagnosis not present

## 2013-02-13 DIAGNOSIS — I2510 Atherosclerotic heart disease of native coronary artery without angina pectoris: Secondary | ICD-10-CM | POA: Diagnosis not present

## 2013-02-13 DIAGNOSIS — M6281 Muscle weakness (generalized): Secondary | ICD-10-CM | POA: Diagnosis not present

## 2013-02-13 DIAGNOSIS — S72009D Fracture of unspecified part of neck of unspecified femur, subsequent encounter for closed fracture with routine healing: Secondary | ICD-10-CM | POA: Diagnosis not present

## 2013-02-13 DIAGNOSIS — I10 Essential (primary) hypertension: Secondary | ICD-10-CM | POA: Diagnosis not present

## 2013-02-14 DIAGNOSIS — S72009A Fracture of unspecified part of neck of unspecified femur, initial encounter for closed fracture: Secondary | ICD-10-CM | POA: Diagnosis not present

## 2013-02-14 DIAGNOSIS — Z96649 Presence of unspecified artificial hip joint: Secondary | ICD-10-CM | POA: Diagnosis not present

## 2013-02-16 DIAGNOSIS — S72009D Fracture of unspecified part of neck of unspecified femur, subsequent encounter for closed fracture with routine healing: Secondary | ICD-10-CM | POA: Diagnosis not present

## 2013-02-16 DIAGNOSIS — M6281 Muscle weakness (generalized): Secondary | ICD-10-CM | POA: Diagnosis not present

## 2013-02-16 DIAGNOSIS — I2510 Atherosclerotic heart disease of native coronary artery without angina pectoris: Secondary | ICD-10-CM | POA: Diagnosis not present

## 2013-02-16 DIAGNOSIS — F0390 Unspecified dementia without behavioral disturbance: Secondary | ICD-10-CM | POA: Diagnosis not present

## 2013-02-16 DIAGNOSIS — E119 Type 2 diabetes mellitus without complications: Secondary | ICD-10-CM | POA: Diagnosis not present

## 2013-02-16 DIAGNOSIS — I10 Essential (primary) hypertension: Secondary | ICD-10-CM | POA: Diagnosis not present

## 2013-02-19 DIAGNOSIS — Z96649 Presence of unspecified artificial hip joint: Secondary | ICD-10-CM | POA: Diagnosis not present

## 2013-02-19 DIAGNOSIS — E1310 Other specified diabetes mellitus with ketoacidosis without coma: Secondary | ICD-10-CM | POA: Diagnosis not present

## 2013-02-20 DIAGNOSIS — F0390 Unspecified dementia without behavioral disturbance: Secondary | ICD-10-CM | POA: Diagnosis not present

## 2013-02-20 DIAGNOSIS — I10 Essential (primary) hypertension: Secondary | ICD-10-CM | POA: Diagnosis not present

## 2013-02-20 DIAGNOSIS — E119 Type 2 diabetes mellitus without complications: Secondary | ICD-10-CM | POA: Diagnosis not present

## 2013-02-20 DIAGNOSIS — I2510 Atherosclerotic heart disease of native coronary artery without angina pectoris: Secondary | ICD-10-CM | POA: Diagnosis not present

## 2013-02-20 DIAGNOSIS — S72009D Fracture of unspecified part of neck of unspecified femur, subsequent encounter for closed fracture with routine healing: Secondary | ICD-10-CM | POA: Diagnosis not present

## 2013-02-20 DIAGNOSIS — M6281 Muscle weakness (generalized): Secondary | ICD-10-CM | POA: Diagnosis not present

## 2013-02-23 DIAGNOSIS — M6281 Muscle weakness (generalized): Secondary | ICD-10-CM | POA: Diagnosis not present

## 2013-02-23 DIAGNOSIS — S72009D Fracture of unspecified part of neck of unspecified femur, subsequent encounter for closed fracture with routine healing: Secondary | ICD-10-CM | POA: Diagnosis not present

## 2013-02-23 DIAGNOSIS — I2510 Atherosclerotic heart disease of native coronary artery without angina pectoris: Secondary | ICD-10-CM | POA: Diagnosis not present

## 2013-02-23 DIAGNOSIS — F0390 Unspecified dementia without behavioral disturbance: Secondary | ICD-10-CM | POA: Diagnosis not present

## 2013-02-23 DIAGNOSIS — E119 Type 2 diabetes mellitus without complications: Secondary | ICD-10-CM | POA: Diagnosis not present

## 2013-02-23 DIAGNOSIS — I10 Essential (primary) hypertension: Secondary | ICD-10-CM | POA: Diagnosis not present

## 2013-02-26 DIAGNOSIS — I2510 Atherosclerotic heart disease of native coronary artery without angina pectoris: Secondary | ICD-10-CM | POA: Diagnosis not present

## 2013-02-26 DIAGNOSIS — M6281 Muscle weakness (generalized): Secondary | ICD-10-CM | POA: Diagnosis not present

## 2013-02-26 DIAGNOSIS — F0390 Unspecified dementia without behavioral disturbance: Secondary | ICD-10-CM | POA: Diagnosis not present

## 2013-02-26 DIAGNOSIS — E119 Type 2 diabetes mellitus without complications: Secondary | ICD-10-CM | POA: Diagnosis not present

## 2013-02-26 DIAGNOSIS — I10 Essential (primary) hypertension: Secondary | ICD-10-CM | POA: Diagnosis not present

## 2013-02-26 DIAGNOSIS — S72009D Fracture of unspecified part of neck of unspecified femur, subsequent encounter for closed fracture with routine healing: Secondary | ICD-10-CM | POA: Diagnosis not present

## 2013-02-27 DIAGNOSIS — I2510 Atherosclerotic heart disease of native coronary artery without angina pectoris: Secondary | ICD-10-CM | POA: Diagnosis not present

## 2013-02-27 DIAGNOSIS — E119 Type 2 diabetes mellitus without complications: Secondary | ICD-10-CM | POA: Diagnosis not present

## 2013-02-27 DIAGNOSIS — S72009D Fracture of unspecified part of neck of unspecified femur, subsequent encounter for closed fracture with routine healing: Secondary | ICD-10-CM | POA: Diagnosis not present

## 2013-02-27 DIAGNOSIS — F0390 Unspecified dementia without behavioral disturbance: Secondary | ICD-10-CM | POA: Diagnosis not present

## 2013-02-27 DIAGNOSIS — I10 Essential (primary) hypertension: Secondary | ICD-10-CM | POA: Diagnosis not present

## 2013-02-27 DIAGNOSIS — M6281 Muscle weakness (generalized): Secondary | ICD-10-CM | POA: Diagnosis not present

## 2013-03-02 DIAGNOSIS — E119 Type 2 diabetes mellitus without complications: Secondary | ICD-10-CM | POA: Diagnosis not present

## 2013-03-02 DIAGNOSIS — F0390 Unspecified dementia without behavioral disturbance: Secondary | ICD-10-CM | POA: Diagnosis not present

## 2013-03-02 DIAGNOSIS — I10 Essential (primary) hypertension: Secondary | ICD-10-CM | POA: Diagnosis not present

## 2013-03-02 DIAGNOSIS — I2510 Atherosclerotic heart disease of native coronary artery without angina pectoris: Secondary | ICD-10-CM | POA: Diagnosis not present

## 2013-03-02 DIAGNOSIS — M6281 Muscle weakness (generalized): Secondary | ICD-10-CM | POA: Diagnosis not present

## 2013-03-02 DIAGNOSIS — S72009D Fracture of unspecified part of neck of unspecified femur, subsequent encounter for closed fracture with routine healing: Secondary | ICD-10-CM | POA: Diagnosis not present

## 2013-03-06 DIAGNOSIS — I10 Essential (primary) hypertension: Secondary | ICD-10-CM | POA: Diagnosis not present

## 2013-03-06 DIAGNOSIS — M6281 Muscle weakness (generalized): Secondary | ICD-10-CM | POA: Diagnosis not present

## 2013-03-06 DIAGNOSIS — I2510 Atherosclerotic heart disease of native coronary artery without angina pectoris: Secondary | ICD-10-CM | POA: Diagnosis not present

## 2013-03-06 DIAGNOSIS — S72009D Fracture of unspecified part of neck of unspecified femur, subsequent encounter for closed fracture with routine healing: Secondary | ICD-10-CM | POA: Diagnosis not present

## 2013-03-06 DIAGNOSIS — F0390 Unspecified dementia without behavioral disturbance: Secondary | ICD-10-CM | POA: Diagnosis not present

## 2013-03-06 DIAGNOSIS — E119 Type 2 diabetes mellitus without complications: Secondary | ICD-10-CM | POA: Diagnosis not present

## 2013-03-09 DIAGNOSIS — S72009D Fracture of unspecified part of neck of unspecified femur, subsequent encounter for closed fracture with routine healing: Secondary | ICD-10-CM | POA: Diagnosis not present

## 2013-03-09 DIAGNOSIS — F0390 Unspecified dementia without behavioral disturbance: Secondary | ICD-10-CM | POA: Diagnosis not present

## 2013-03-09 DIAGNOSIS — I10 Essential (primary) hypertension: Secondary | ICD-10-CM | POA: Diagnosis not present

## 2013-03-09 DIAGNOSIS — E119 Type 2 diabetes mellitus without complications: Secondary | ICD-10-CM | POA: Diagnosis not present

## 2013-03-09 DIAGNOSIS — I2510 Atherosclerotic heart disease of native coronary artery without angina pectoris: Secondary | ICD-10-CM | POA: Diagnosis not present

## 2013-03-09 DIAGNOSIS — M6281 Muscle weakness (generalized): Secondary | ICD-10-CM | POA: Diagnosis not present

## 2013-03-13 DIAGNOSIS — M6281 Muscle weakness (generalized): Secondary | ICD-10-CM | POA: Diagnosis not present

## 2013-03-13 DIAGNOSIS — I2510 Atherosclerotic heart disease of native coronary artery without angina pectoris: Secondary | ICD-10-CM | POA: Diagnosis not present

## 2013-03-13 DIAGNOSIS — I10 Essential (primary) hypertension: Secondary | ICD-10-CM | POA: Diagnosis not present

## 2013-03-13 DIAGNOSIS — F0390 Unspecified dementia without behavioral disturbance: Secondary | ICD-10-CM | POA: Diagnosis not present

## 2013-03-13 DIAGNOSIS — E119 Type 2 diabetes mellitus without complications: Secondary | ICD-10-CM | POA: Diagnosis not present

## 2013-03-13 DIAGNOSIS — S72009D Fracture of unspecified part of neck of unspecified femur, subsequent encounter for closed fracture with routine healing: Secondary | ICD-10-CM | POA: Diagnosis not present

## 2013-03-15 DIAGNOSIS — S72009D Fracture of unspecified part of neck of unspecified femur, subsequent encounter for closed fracture with routine healing: Secondary | ICD-10-CM | POA: Diagnosis not present

## 2013-03-15 DIAGNOSIS — M6281 Muscle weakness (generalized): Secondary | ICD-10-CM | POA: Diagnosis not present

## 2013-03-15 DIAGNOSIS — I10 Essential (primary) hypertension: Secondary | ICD-10-CM | POA: Diagnosis not present

## 2013-03-15 DIAGNOSIS — F0390 Unspecified dementia without behavioral disturbance: Secondary | ICD-10-CM | POA: Diagnosis not present

## 2013-03-15 DIAGNOSIS — E119 Type 2 diabetes mellitus without complications: Secondary | ICD-10-CM | POA: Diagnosis not present

## 2013-03-15 DIAGNOSIS — I2510 Atherosclerotic heart disease of native coronary artery without angina pectoris: Secondary | ICD-10-CM | POA: Diagnosis not present

## 2013-03-20 DIAGNOSIS — M6281 Muscle weakness (generalized): Secondary | ICD-10-CM | POA: Diagnosis not present

## 2013-03-20 DIAGNOSIS — I2510 Atherosclerotic heart disease of native coronary artery without angina pectoris: Secondary | ICD-10-CM | POA: Diagnosis not present

## 2013-03-20 DIAGNOSIS — S72009D Fracture of unspecified part of neck of unspecified femur, subsequent encounter for closed fracture with routine healing: Secondary | ICD-10-CM | POA: Diagnosis not present

## 2013-03-20 DIAGNOSIS — F0390 Unspecified dementia without behavioral disturbance: Secondary | ICD-10-CM | POA: Diagnosis not present

## 2013-03-20 DIAGNOSIS — E119 Type 2 diabetes mellitus without complications: Secondary | ICD-10-CM | POA: Diagnosis not present

## 2013-03-20 DIAGNOSIS — I10 Essential (primary) hypertension: Secondary | ICD-10-CM | POA: Diagnosis not present

## 2013-03-23 DIAGNOSIS — I2510 Atherosclerotic heart disease of native coronary artery without angina pectoris: Secondary | ICD-10-CM | POA: Diagnosis not present

## 2013-03-23 DIAGNOSIS — S72009D Fracture of unspecified part of neck of unspecified femur, subsequent encounter for closed fracture with routine healing: Secondary | ICD-10-CM | POA: Diagnosis not present

## 2013-03-23 DIAGNOSIS — E119 Type 2 diabetes mellitus without complications: Secondary | ICD-10-CM | POA: Diagnosis not present

## 2013-03-23 DIAGNOSIS — I10 Essential (primary) hypertension: Secondary | ICD-10-CM | POA: Diagnosis not present

## 2013-03-23 DIAGNOSIS — M6281 Muscle weakness (generalized): Secondary | ICD-10-CM | POA: Diagnosis not present

## 2013-03-23 DIAGNOSIS — F0390 Unspecified dementia without behavioral disturbance: Secondary | ICD-10-CM | POA: Diagnosis not present

## 2013-05-01 DIAGNOSIS — E119 Type 2 diabetes mellitus without complications: Secondary | ICD-10-CM | POA: Diagnosis not present

## 2013-05-01 DIAGNOSIS — E1159 Type 2 diabetes mellitus with other circulatory complications: Secondary | ICD-10-CM | POA: Diagnosis not present

## 2013-05-21 DIAGNOSIS — I10 Essential (primary) hypertension: Secondary | ICD-10-CM | POA: Diagnosis not present

## 2013-05-28 DIAGNOSIS — H547 Unspecified visual loss: Secondary | ICD-10-CM | POA: Diagnosis not present

## 2013-05-28 DIAGNOSIS — E1139 Type 2 diabetes mellitus with other diabetic ophthalmic complication: Secondary | ICD-10-CM | POA: Diagnosis not present

## 2013-05-28 DIAGNOSIS — H40019 Open angle with borderline findings, low risk, unspecified eye: Secondary | ICD-10-CM | POA: Diagnosis not present

## 2013-07-10 DIAGNOSIS — E1159 Type 2 diabetes mellitus with other circulatory complications: Secondary | ICD-10-CM | POA: Diagnosis not present

## 2013-07-10 DIAGNOSIS — E119 Type 2 diabetes mellitus without complications: Secondary | ICD-10-CM | POA: Diagnosis not present

## 2013-08-21 DIAGNOSIS — IMO0002 Reserved for concepts with insufficient information to code with codable children: Secondary | ICD-10-CM | POA: Diagnosis not present

## 2013-08-21 DIAGNOSIS — I10 Essential (primary) hypertension: Secondary | ICD-10-CM | POA: Diagnosis not present

## 2013-08-21 DIAGNOSIS — M47817 Spondylosis without myelopathy or radiculopathy, lumbosacral region: Secondary | ICD-10-CM | POA: Diagnosis not present

## 2013-08-21 DIAGNOSIS — Z96649 Presence of unspecified artificial hip joint: Secondary | ICD-10-CM | POA: Diagnosis not present

## 2013-08-21 DIAGNOSIS — Z471 Aftercare following joint replacement surgery: Secondary | ICD-10-CM | POA: Diagnosis not present

## 2013-08-21 DIAGNOSIS — M25559 Pain in unspecified hip: Secondary | ICD-10-CM | POA: Diagnosis not present

## 2013-09-14 DIAGNOSIS — Z23 Encounter for immunization: Secondary | ICD-10-CM | POA: Diagnosis not present

## 2013-09-18 DIAGNOSIS — E1159 Type 2 diabetes mellitus with other circulatory complications: Secondary | ICD-10-CM | POA: Diagnosis not present

## 2013-09-18 DIAGNOSIS — E119 Type 2 diabetes mellitus without complications: Secondary | ICD-10-CM | POA: Diagnosis not present

## 2013-10-07 DIAGNOSIS — E785 Hyperlipidemia, unspecified: Secondary | ICD-10-CM | POA: Diagnosis present

## 2013-10-07 DIAGNOSIS — Z7982 Long term (current) use of aspirin: Secondary | ICD-10-CM | POA: Diagnosis not present

## 2013-10-07 DIAGNOSIS — Z7902 Long term (current) use of antithrombotics/antiplatelets: Secondary | ICD-10-CM | POA: Diagnosis not present

## 2013-10-07 DIAGNOSIS — K921 Melena: Secondary | ICD-10-CM | POA: Diagnosis not present

## 2013-10-07 DIAGNOSIS — Z79899 Other long term (current) drug therapy: Secondary | ICD-10-CM | POA: Diagnosis not present

## 2013-10-07 DIAGNOSIS — K5730 Diverticulosis of large intestine without perforation or abscess without bleeding: Secondary | ICD-10-CM | POA: Diagnosis not present

## 2013-10-07 DIAGNOSIS — M1710 Unilateral primary osteoarthritis, unspecified knee: Secondary | ICD-10-CM | POA: Diagnosis present

## 2013-10-07 DIAGNOSIS — I5032 Chronic diastolic (congestive) heart failure: Secondary | ICD-10-CM | POA: Diagnosis not present

## 2013-10-07 DIAGNOSIS — I10 Essential (primary) hypertension: Secondary | ICD-10-CM | POA: Diagnosis present

## 2013-10-07 DIAGNOSIS — I2510 Atherosclerotic heart disease of native coronary artery without angina pectoris: Secondary | ICD-10-CM | POA: Diagnosis present

## 2013-10-07 DIAGNOSIS — N281 Cyst of kidney, acquired: Secondary | ICD-10-CM | POA: Diagnosis not present

## 2013-10-07 DIAGNOSIS — K449 Diaphragmatic hernia without obstruction or gangrene: Secondary | ICD-10-CM | POA: Diagnosis present

## 2013-10-07 DIAGNOSIS — E119 Type 2 diabetes mellitus without complications: Secondary | ICD-10-CM | POA: Diagnosis not present

## 2013-10-07 DIAGNOSIS — Z780 Asymptomatic menopausal state: Secondary | ICD-10-CM | POA: Diagnosis not present

## 2013-10-07 DIAGNOSIS — K5731 Diverticulosis of large intestine without perforation or abscess with bleeding: Secondary | ICD-10-CM | POA: Diagnosis not present

## 2013-10-07 DIAGNOSIS — Z954 Presence of other heart-valve replacement: Secondary | ICD-10-CM | POA: Diagnosis not present

## 2013-10-07 DIAGNOSIS — Z951 Presence of aortocoronary bypass graft: Secondary | ICD-10-CM | POA: Diagnosis not present

## 2013-10-07 DIAGNOSIS — K922 Gastrointestinal hemorrhage, unspecified: Secondary | ICD-10-CM | POA: Diagnosis not present

## 2013-10-07 DIAGNOSIS — K5732 Diverticulitis of large intestine without perforation or abscess without bleeding: Secondary | ICD-10-CM | POA: Diagnosis not present

## 2013-10-07 DIAGNOSIS — N390 Urinary tract infection, site not specified: Secondary | ICD-10-CM | POA: Diagnosis not present

## 2013-10-07 DIAGNOSIS — I509 Heart failure, unspecified: Secondary | ICD-10-CM | POA: Diagnosis not present

## 2013-10-23 DIAGNOSIS — K5732 Diverticulitis of large intestine without perforation or abscess without bleeding: Secondary | ICD-10-CM | POA: Diagnosis not present

## 2013-10-23 DIAGNOSIS — Z0000 Encounter for general adult medical examination without abnormal findings: Secondary | ICD-10-CM | POA: Diagnosis not present

## 2013-11-06 DIAGNOSIS — K625 Hemorrhage of anus and rectum: Secondary | ICD-10-CM | POA: Diagnosis not present

## 2013-11-21 DIAGNOSIS — I10 Essential (primary) hypertension: Secondary | ICD-10-CM | POA: Diagnosis not present

## 2013-12-04 DIAGNOSIS — E1159 Type 2 diabetes mellitus with other circulatory complications: Secondary | ICD-10-CM | POA: Diagnosis not present

## 2013-12-04 DIAGNOSIS — E119 Type 2 diabetes mellitus without complications: Secondary | ICD-10-CM | POA: Diagnosis not present

## 2014-02-19 DIAGNOSIS — IMO0001 Reserved for inherently not codable concepts without codable children: Secondary | ICD-10-CM | POA: Diagnosis not present

## 2014-02-19 DIAGNOSIS — I10 Essential (primary) hypertension: Secondary | ICD-10-CM | POA: Diagnosis not present

## 2014-02-26 DIAGNOSIS — E1159 Type 2 diabetes mellitus with other circulatory complications: Secondary | ICD-10-CM | POA: Diagnosis not present

## 2014-02-26 DIAGNOSIS — E119 Type 2 diabetes mellitus without complications: Secondary | ICD-10-CM | POA: Diagnosis not present

## 2014-05-14 DIAGNOSIS — E1159 Type 2 diabetes mellitus with other circulatory complications: Secondary | ICD-10-CM | POA: Diagnosis not present

## 2014-05-14 DIAGNOSIS — E119 Type 2 diabetes mellitus without complications: Secondary | ICD-10-CM | POA: Diagnosis not present

## 2014-05-20 DIAGNOSIS — I10 Essential (primary) hypertension: Secondary | ICD-10-CM | POA: Diagnosis not present

## 2014-05-20 DIAGNOSIS — IMO0001 Reserved for inherently not codable concepts without codable children: Secondary | ICD-10-CM | POA: Diagnosis not present

## 2014-06-10 DIAGNOSIS — Z006 Encounter for examination for normal comparison and control in clinical research program: Secondary | ICD-10-CM | POA: Diagnosis not present

## 2014-06-10 DIAGNOSIS — Z954 Presence of other heart-valve replacement: Secondary | ICD-10-CM | POA: Diagnosis not present

## 2014-07-30 DIAGNOSIS — E119 Type 2 diabetes mellitus without complications: Secondary | ICD-10-CM | POA: Diagnosis not present

## 2014-07-30 DIAGNOSIS — E1159 Type 2 diabetes mellitus with other circulatory complications: Secondary | ICD-10-CM | POA: Diagnosis not present

## 2014-08-20 DIAGNOSIS — I10 Essential (primary) hypertension: Secondary | ICD-10-CM | POA: Diagnosis not present

## 2014-08-20 DIAGNOSIS — IMO0001 Reserved for inherently not codable concepts without codable children: Secondary | ICD-10-CM | POA: Diagnosis not present

## 2014-09-19 DIAGNOSIS — Z23 Encounter for immunization: Secondary | ICD-10-CM | POA: Diagnosis not present

## 2014-10-08 DIAGNOSIS — E1151 Type 2 diabetes mellitus with diabetic peripheral angiopathy without gangrene: Secondary | ICD-10-CM | POA: Diagnosis not present

## 2014-10-08 DIAGNOSIS — E1142 Type 2 diabetes mellitus with diabetic polyneuropathy: Secondary | ICD-10-CM | POA: Diagnosis not present

## 2014-11-20 DIAGNOSIS — E1165 Type 2 diabetes mellitus with hyperglycemia: Secondary | ICD-10-CM | POA: Diagnosis not present

## 2014-11-20 DIAGNOSIS — Z0000 Encounter for general adult medical examination without abnormal findings: Secondary | ICD-10-CM | POA: Diagnosis not present

## 2014-11-20 DIAGNOSIS — Z1389 Encounter for screening for other disorder: Secondary | ICD-10-CM | POA: Diagnosis not present

## 2014-11-20 DIAGNOSIS — I10 Essential (primary) hypertension: Secondary | ICD-10-CM | POA: Diagnosis not present

## 2014-12-17 DIAGNOSIS — E1142 Type 2 diabetes mellitus with diabetic polyneuropathy: Secondary | ICD-10-CM | POA: Diagnosis not present

## 2014-12-17 DIAGNOSIS — E1151 Type 2 diabetes mellitus with diabetic peripheral angiopathy without gangrene: Secondary | ICD-10-CM | POA: Diagnosis not present

## 2015-02-20 DIAGNOSIS — I10 Essential (primary) hypertension: Secondary | ICD-10-CM | POA: Diagnosis not present

## 2015-02-20 DIAGNOSIS — E1165 Type 2 diabetes mellitus with hyperglycemia: Secondary | ICD-10-CM | POA: Diagnosis not present

## 2015-03-11 DIAGNOSIS — E1142 Type 2 diabetes mellitus with diabetic polyneuropathy: Secondary | ICD-10-CM | POA: Diagnosis not present

## 2015-03-11 DIAGNOSIS — E1151 Type 2 diabetes mellitus with diabetic peripheral angiopathy without gangrene: Secondary | ICD-10-CM | POA: Diagnosis not present

## 2015-04-28 DIAGNOSIS — K810 Acute cholecystitis: Secondary | ICD-10-CM | POA: Diagnosis not present

## 2015-04-29 DIAGNOSIS — Z952 Presence of prosthetic heart valve: Secondary | ICD-10-CM | POA: Diagnosis not present

## 2015-04-29 DIAGNOSIS — G459 Transient cerebral ischemic attack, unspecified: Secondary | ICD-10-CM | POA: Diagnosis not present

## 2015-04-29 DIAGNOSIS — E785 Hyperlipidemia, unspecified: Secondary | ICD-10-CM | POA: Diagnosis not present

## 2015-04-29 DIAGNOSIS — Z79899 Other long term (current) drug therapy: Secondary | ICD-10-CM | POA: Diagnosis not present

## 2015-04-29 DIAGNOSIS — K8020 Calculus of gallbladder without cholecystitis without obstruction: Secondary | ICD-10-CM | POA: Diagnosis not present

## 2015-04-29 DIAGNOSIS — R0602 Shortness of breath: Secondary | ICD-10-CM | POA: Diagnosis not present

## 2015-04-29 DIAGNOSIS — Z7982 Long term (current) use of aspirin: Secondary | ICD-10-CM | POA: Diagnosis not present

## 2015-04-29 DIAGNOSIS — I5032 Chronic diastolic (congestive) heart failure: Secondary | ICD-10-CM | POA: Diagnosis not present

## 2015-04-29 DIAGNOSIS — R531 Weakness: Secondary | ICD-10-CM | POA: Diagnosis not present

## 2015-04-29 DIAGNOSIS — N959 Unspecified menopausal and perimenopausal disorder: Secondary | ICD-10-CM | POA: Diagnosis not present

## 2015-04-29 DIAGNOSIS — N281 Cyst of kidney, acquired: Secondary | ICD-10-CM | POA: Diagnosis not present

## 2015-04-29 DIAGNOSIS — E119 Type 2 diabetes mellitus without complications: Secondary | ICD-10-CM | POA: Diagnosis not present

## 2015-04-29 DIAGNOSIS — K449 Diaphragmatic hernia without obstruction or gangrene: Secondary | ICD-10-CM | POA: Diagnosis not present

## 2015-04-30 DIAGNOSIS — G459 Transient cerebral ischemic attack, unspecified: Secondary | ICD-10-CM | POA: Diagnosis not present

## 2015-04-30 DIAGNOSIS — K8020 Calculus of gallbladder without cholecystitis without obstruction: Secondary | ICD-10-CM | POA: Diagnosis not present

## 2015-04-30 DIAGNOSIS — I5032 Chronic diastolic (congestive) heart failure: Secondary | ICD-10-CM | POA: Diagnosis not present

## 2015-04-30 DIAGNOSIS — Z952 Presence of prosthetic heart valve: Secondary | ICD-10-CM | POA: Diagnosis not present

## 2015-04-30 DIAGNOSIS — E785 Hyperlipidemia, unspecified: Secondary | ICD-10-CM | POA: Diagnosis not present

## 2015-04-30 DIAGNOSIS — E119 Type 2 diabetes mellitus without complications: Secondary | ICD-10-CM | POA: Diagnosis not present

## 2015-05-08 DIAGNOSIS — G459 Transient cerebral ischemic attack, unspecified: Secondary | ICD-10-CM | POA: Diagnosis not present

## 2015-07-07 DIAGNOSIS — E1142 Type 2 diabetes mellitus with diabetic polyneuropathy: Secondary | ICD-10-CM | POA: Diagnosis not present

## 2015-07-07 DIAGNOSIS — E1151 Type 2 diabetes mellitus with diabetic peripheral angiopathy without gangrene: Secondary | ICD-10-CM | POA: Diagnosis not present

## 2015-08-04 DIAGNOSIS — E119 Type 2 diabetes mellitus without complications: Secondary | ICD-10-CM | POA: Diagnosis not present

## 2015-08-04 DIAGNOSIS — I10 Essential (primary) hypertension: Secondary | ICD-10-CM | POA: Diagnosis not present

## 2015-09-03 DIAGNOSIS — Z23 Encounter for immunization: Secondary | ICD-10-CM | POA: Diagnosis not present

## 2015-09-15 DIAGNOSIS — E1142 Type 2 diabetes mellitus with diabetic polyneuropathy: Secondary | ICD-10-CM | POA: Diagnosis not present

## 2015-09-15 DIAGNOSIS — E1151 Type 2 diabetes mellitus with diabetic peripheral angiopathy without gangrene: Secondary | ICD-10-CM | POA: Diagnosis not present

## 2015-11-11 DIAGNOSIS — I10 Essential (primary) hypertension: Secondary | ICD-10-CM | POA: Diagnosis not present

## 2015-11-11 DIAGNOSIS — E1142 Type 2 diabetes mellitus with diabetic polyneuropathy: Secondary | ICD-10-CM | POA: Diagnosis not present

## 2015-11-24 DIAGNOSIS — E1142 Type 2 diabetes mellitus with diabetic polyneuropathy: Secondary | ICD-10-CM | POA: Diagnosis not present

## 2015-11-24 DIAGNOSIS — E1151 Type 2 diabetes mellitus with diabetic peripheral angiopathy without gangrene: Secondary | ICD-10-CM | POA: Diagnosis not present

## 2015-12-18 DIAGNOSIS — E119 Type 2 diabetes mellitus without complications: Secondary | ICD-10-CM | POA: Diagnosis not present

## 2016-02-02 DIAGNOSIS — W1839XA Other fall on same level, initial encounter: Secondary | ICD-10-CM | POA: Diagnosis not present

## 2016-02-02 DIAGNOSIS — Z79899 Other long term (current) drug therapy: Secondary | ICD-10-CM | POA: Diagnosis not present

## 2016-02-02 DIAGNOSIS — Z7982 Long term (current) use of aspirin: Secondary | ICD-10-CM | POA: Diagnosis not present

## 2016-02-02 DIAGNOSIS — E785 Hyperlipidemia, unspecified: Secondary | ICD-10-CM | POA: Diagnosis not present

## 2016-02-02 DIAGNOSIS — Z7984 Long term (current) use of oral hypoglycemic drugs: Secondary | ICD-10-CM | POA: Diagnosis not present

## 2016-02-02 DIAGNOSIS — R220 Localized swelling, mass and lump, head: Secondary | ICD-10-CM | POA: Diagnosis not present

## 2016-02-02 DIAGNOSIS — Z780 Asymptomatic menopausal state: Secondary | ICD-10-CM | POA: Diagnosis not present

## 2016-02-02 DIAGNOSIS — I110 Hypertensive heart disease with heart failure: Secondary | ICD-10-CM | POA: Diagnosis not present

## 2016-02-02 DIAGNOSIS — K449 Diaphragmatic hernia without obstruction or gangrene: Secondary | ICD-10-CM | POA: Diagnosis not present

## 2016-02-02 DIAGNOSIS — Z954 Presence of other heart-valve replacement: Secondary | ICD-10-CM | POA: Diagnosis not present

## 2016-02-02 DIAGNOSIS — I5032 Chronic diastolic (congestive) heart failure: Secondary | ICD-10-CM | POA: Diagnosis not present

## 2016-02-02 DIAGNOSIS — E1142 Type 2 diabetes mellitus with diabetic polyneuropathy: Secondary | ICD-10-CM | POA: Diagnosis not present

## 2016-02-02 DIAGNOSIS — R55 Syncope and collapse: Secondary | ICD-10-CM | POA: Diagnosis not present

## 2016-02-02 DIAGNOSIS — E1151 Type 2 diabetes mellitus with diabetic peripheral angiopathy without gangrene: Secondary | ICD-10-CM | POA: Diagnosis not present

## 2016-02-02 DIAGNOSIS — S0083XA Contusion of other part of head, initial encounter: Secondary | ICD-10-CM | POA: Diagnosis not present

## 2016-02-03 DIAGNOSIS — Z954 Presence of other heart-valve replacement: Secondary | ICD-10-CM | POA: Diagnosis not present

## 2016-02-03 DIAGNOSIS — R55 Syncope and collapse: Secondary | ICD-10-CM | POA: Diagnosis not present

## 2016-02-03 DIAGNOSIS — I5032 Chronic diastolic (congestive) heart failure: Secondary | ICD-10-CM | POA: Diagnosis not present

## 2016-02-03 DIAGNOSIS — W1839XA Other fall on same level, initial encounter: Secondary | ICD-10-CM | POA: Diagnosis not present

## 2016-02-03 DIAGNOSIS — S0083XA Contusion of other part of head, initial encounter: Secondary | ICD-10-CM | POA: Diagnosis not present

## 2016-02-10 DIAGNOSIS — Z0000 Encounter for general adult medical examination without abnormal findings: Secondary | ICD-10-CM | POA: Diagnosis not present

## 2016-02-10 DIAGNOSIS — Z1389 Encounter for screening for other disorder: Secondary | ICD-10-CM | POA: Diagnosis not present

## 2016-02-10 DIAGNOSIS — R55 Syncope and collapse: Secondary | ICD-10-CM | POA: Diagnosis not present

## 2016-04-21 DIAGNOSIS — E1142 Type 2 diabetes mellitus with diabetic polyneuropathy: Secondary | ICD-10-CM | POA: Diagnosis not present

## 2016-04-21 DIAGNOSIS — E1151 Type 2 diabetes mellitus with diabetic peripheral angiopathy without gangrene: Secondary | ICD-10-CM | POA: Diagnosis not present

## 2016-05-13 DIAGNOSIS — K210 Gastro-esophageal reflux disease with esophagitis: Secondary | ICD-10-CM | POA: Diagnosis not present

## 2016-05-13 DIAGNOSIS — E784 Other hyperlipidemia: Secondary | ICD-10-CM | POA: Diagnosis not present

## 2016-05-13 DIAGNOSIS — I10 Essential (primary) hypertension: Secondary | ICD-10-CM | POA: Diagnosis not present

## 2016-05-13 DIAGNOSIS — E1165 Type 2 diabetes mellitus with hyperglycemia: Secondary | ICD-10-CM | POA: Diagnosis not present

## 2016-05-13 DIAGNOSIS — M1009 Idiopathic gout, multiple sites: Secondary | ICD-10-CM | POA: Diagnosis not present

## 2016-05-13 DIAGNOSIS — I358 Other nonrheumatic aortic valve disorders: Secondary | ICD-10-CM | POA: Diagnosis not present

## 2016-07-06 DIAGNOSIS — E1142 Type 2 diabetes mellitus with diabetic polyneuropathy: Secondary | ICD-10-CM | POA: Diagnosis not present

## 2016-07-06 DIAGNOSIS — E1151 Type 2 diabetes mellitus with diabetic peripheral angiopathy without gangrene: Secondary | ICD-10-CM | POA: Diagnosis not present

## 2016-08-13 DIAGNOSIS — N6459 Other signs and symptoms in breast: Secondary | ICD-10-CM | POA: Diagnosis not present

## 2016-08-13 DIAGNOSIS — K210 Gastro-esophageal reflux disease with esophagitis: Secondary | ICD-10-CM | POA: Diagnosis not present

## 2016-08-13 DIAGNOSIS — M1009 Idiopathic gout, multiple sites: Secondary | ICD-10-CM | POA: Diagnosis not present

## 2016-08-13 DIAGNOSIS — E1165 Type 2 diabetes mellitus with hyperglycemia: Secondary | ICD-10-CM | POA: Diagnosis not present

## 2016-08-13 DIAGNOSIS — I10 Essential (primary) hypertension: Secondary | ICD-10-CM | POA: Diagnosis not present

## 2016-08-13 DIAGNOSIS — E784 Other hyperlipidemia: Secondary | ICD-10-CM | POA: Diagnosis not present

## 2016-08-25 DIAGNOSIS — N6324 Unspecified lump in the left breast, lower inner quadrant: Secondary | ICD-10-CM | POA: Diagnosis not present

## 2016-08-25 DIAGNOSIS — R922 Inconclusive mammogram: Secondary | ICD-10-CM | POA: Diagnosis not present

## 2016-08-25 DIAGNOSIS — N6312 Unspecified lump in the right breast, upper inner quadrant: Secondary | ICD-10-CM | POA: Diagnosis not present

## 2016-09-08 DIAGNOSIS — N6312 Unspecified lump in the right breast, upper inner quadrant: Secondary | ICD-10-CM | POA: Diagnosis not present

## 2016-09-08 DIAGNOSIS — D242 Benign neoplasm of left breast: Secondary | ICD-10-CM | POA: Diagnosis not present

## 2016-09-08 DIAGNOSIS — C50911 Malignant neoplasm of unspecified site of right female breast: Secondary | ICD-10-CM | POA: Diagnosis not present

## 2016-09-08 DIAGNOSIS — N6324 Unspecified lump in the left breast, lower inner quadrant: Secondary | ICD-10-CM | POA: Diagnosis not present

## 2016-09-08 DIAGNOSIS — N63 Unspecified lump in unspecified breast: Secondary | ICD-10-CM | POA: Diagnosis not present

## 2016-09-15 DIAGNOSIS — C50211 Malignant neoplasm of upper-inner quadrant of right female breast: Secondary | ICD-10-CM | POA: Diagnosis not present

## 2016-09-17 ENCOUNTER — Other Ambulatory Visit (HOSPITAL_COMMUNITY): Payer: Self-pay | Admitting: Internal Medicine

## 2016-09-17 DIAGNOSIS — N6459 Other signs and symptoms in breast: Secondary | ICD-10-CM

## 2016-09-20 DIAGNOSIS — Z23 Encounter for immunization: Secondary | ICD-10-CM | POA: Diagnosis not present

## 2016-09-21 DIAGNOSIS — E1142 Type 2 diabetes mellitus with diabetic polyneuropathy: Secondary | ICD-10-CM | POA: Diagnosis not present

## 2016-09-21 DIAGNOSIS — E1151 Type 2 diabetes mellitus with diabetic peripheral angiopathy without gangrene: Secondary | ICD-10-CM | POA: Diagnosis not present

## 2016-09-23 DIAGNOSIS — E1165 Type 2 diabetes mellitus with hyperglycemia: Secondary | ICD-10-CM | POA: Diagnosis not present

## 2016-09-23 DIAGNOSIS — E784 Other hyperlipidemia: Secondary | ICD-10-CM | POA: Diagnosis not present

## 2016-09-23 DIAGNOSIS — I10 Essential (primary) hypertension: Secondary | ICD-10-CM | POA: Diagnosis not present

## 2016-09-23 DIAGNOSIS — K210 Gastro-esophageal reflux disease with esophagitis: Secondary | ICD-10-CM | POA: Diagnosis not present

## 2016-09-23 DIAGNOSIS — M1009 Idiopathic gout, multiple sites: Secondary | ICD-10-CM | POA: Diagnosis not present

## 2016-09-27 ENCOUNTER — Encounter (HOSPITAL_COMMUNITY)
Admission: RE | Admit: 2016-09-27 | Discharge: 2016-09-27 | Disposition: A | Discharge status: Home / Self Care | Payer: Medicare Other | Source: Ambulatory Visit | Attending: Internal Medicine | Admitting: Internal Medicine

## 2016-09-27 DIAGNOSIS — N6459 Other signs and symptoms in breast: Secondary | ICD-10-CM | POA: Insufficient documentation

## 2016-09-27 DIAGNOSIS — C50911 Malignant neoplasm of unspecified site of right female breast: Secondary | ICD-10-CM | POA: Diagnosis not present

## 2016-09-27 LAB — GLUCOSE, CAPILLARY: Glucose-Capillary: 86 mg/dL (ref 65–99)

## 2016-09-27 MED ORDER — FLUDEOXYGLUCOSE F - 18 (FDG) INJECTION
5.7800 | Freq: Once | INTRAVENOUS | Status: AC | PRN
  Administered 2016-09-27: 5.78 via INTRAVENOUS

## 2016-09-28 ENCOUNTER — Encounter (HOSPITAL_COMMUNITY): Payer: Self-pay | Admitting: Hematology & Oncology

## 2016-09-28 ENCOUNTER — Encounter (HOSPITAL_COMMUNITY): Payer: Medicare Other | Attending: Hematology & Oncology | Admitting: Hematology & Oncology

## 2016-09-28 VITALS — BP 156/52 | HR 58 | Temp 98.6°F | Resp 16 | Ht 64.0 in | Wt 112.7 lb

## 2016-09-28 DIAGNOSIS — R911 Solitary pulmonary nodule: Secondary | ICD-10-CM

## 2016-09-28 DIAGNOSIS — C50911 Malignant neoplasm of unspecified site of right female breast: Secondary | ICD-10-CM | POA: Diagnosis not present

## 2016-09-28 DIAGNOSIS — Z170 Estrogen receptor positive status [ER+]: Secondary | ICD-10-CM | POA: Diagnosis not present

## 2016-09-28 DIAGNOSIS — C50211 Malignant neoplasm of upper-inner quadrant of right female breast: Secondary | ICD-10-CM

## 2016-09-28 DIAGNOSIS — R54 Age-related physical debility: Secondary | ICD-10-CM

## 2016-09-28 MED ORDER — ANASTROZOLE 1 MG PO TABS: 1.0000 mg | ORAL_TABLET | Freq: Every day | ORAL | 0 refills | Status: DC

## 2016-09-28 MED ORDER — ANASTROZOLE 1 MG PO TABS
1.0000 mg | ORAL_TABLET | Freq: Every day | ORAL | 0 refills | Status: AC
Start: 2016-09-28 — End: 2016-12-27

## 2016-09-28 NOTE — Patient Instructions (Addendum)
[place] at [facility] Discharge Instructions  RECOMMENDATIONS MADE BY THE CONSULTANT AND ANY TEST RESULTS WILL BE SENT TO YOUR REFERRING PHYSICIAN.  Exam and discussion today with Dr. Whitney Muse. Arimidex will be faxed to [facility]. Continue on Ensure as you have been.  Follow up with Dr. Whitney Muse as scheduled in one month.  Thank you for choosing [place] at [facility] to provide your oncology and hematology care.  To afford each patient quality time with our provider, please arrive at least 15 minutes before your scheduled appointment time.   Beginning January 23rd 2017 lab work for the [facility] will be done in the  Main lab at [facility] on 1st floor. If you have a lab appointment with the [facility] please come in thru the  Main Entrance and check in at the main information desk  You need to re-schedule your appointment should you arrive 10 or more minutes late.  We strive to give you quality time with our providers, and arriving late affects you and other patients whose appointments are after yours.  Also, if you no show three or more times for appointments you may be dismissed from the clinic at the providers discretion.     Again, thank you for choosing [facility].  Our hope is that these requests will decrease the amount of time that you wait before being seen by our physicians.       _____________________________________________________________  Should you have questions after your visit to [facility], please contact our office at [phone] between the hours of 8:30 a.m. and 4:30 p.m.  Voicemails left after 4:30 p.m. will not be returned until the following business day.  For prescription refill requests, have your pharmacy contact our office.         Resources For Cancer Patients and their Caregivers ? American Cancer Society: Can assist with transportation, wigs, general needs, runs Look  Good Feel Better.        ([phone] ? Cancer Care: Provides financial assistance, online support groups, medication/co-pay assistance.  1-800-813-HOPE 440-733-9326) ? [place] Assists [place] cancer patients and their families through emotional , educational and financial support.  762-358-9915 ? [place] DSS Where to apply for food stamps, Medicaid and utility assistance. 7401901675 ? RCATS: Transportation to medical appointments. 863-388-2277 ? Social Security Administration: May apply for disability if have a Stage IV cancer. 904-566-7057 ([phone] ? [employer], Disability and Transit Services: Assists with nutrition, care and transit needs. [place] Support Programs: @10RELATIVEDAYS @ > Cancer Support Group  2nd Tuesday of the month 1pm-2pm, Journey Room  > Creative Journey  3rd Tuesday of the month 1130am-1pm, Journey Room  > Look Good Feel Better  1st Wednesday of the month 10am-12 noon, Journey Room (Call American Cancer Society to register [phone])  Anastrozole tablets What is this medicine? ANASTROZOLE (an AS troe zole) is used to treat breast cancer in women who have gone through menopause. Some types of breast cancer depend on estrogen to grow, and this medicine can stop tumor growth by blocking estrogen production. This medicine may be used for other purposes; ask your health care provider or pharmacist if you have questions. What should I tell my health care provider before I take this medicine? They need to know if you have any of these conditions: -liver disease -an unusual or allergic reaction to anastrozole, other medicines, foods, dyes, or  preservatives -pregnant or trying to get pregnant -breast-feeding How should I use this medicine? Take this medicine by mouth with a glass of water. Follow the directions on the prescription label. You can take this medicine with or without food. Take  your doses at regular intervals. Do not take your medicine more often than directed. Do not stop taking except on the advice of your doctor or health care professional. Talk to your pediatrician regarding the use of this medicine in children. Special care may be needed. Overdosage: If you think you have taken too much of this medicine contact a poison control center or emergency room at once. NOTE: This medicine is only for you. Do not share this medicine with others. What if I miss a dose? If you miss a dose, take it as soon as you can. If it is almost time for your next dose, take only that dose. Do not take double or extra doses. What may interact with this medicine? Do not take this medicine with any of the following medications: -female hormones, like estrogens or progestins and birth control pills This medicine may also interact with the following medications: -tamoxifen This list may not describe all possible interactions. Give your health care provider a list of all the medicines, herbs, non-prescription drugs, or dietary supplements you use. Also tell them if you smoke, drink alcohol, or use illegal drugs. Some items may interact with your medicine. What should I watch for while using this medicine? Visit your doctor or health care professional for regular checks on your progress. Let your doctor or health care professional know about any unusual vaginal bleeding. Do not treat yourself for diarrhea, nausea, vomiting or other side effects. Ask your doctor or health care professional for advice. What side effects may I notice from receiving this medicine? Side effects that you should report to your doctor or health care professional as soon as possible: -allergic reactions like skin rash, itching or hives, swelling of the face, lips, or tongue -any new or unusual symptoms -breathing problems -chest pain -leg pain or swelling -vomiting Side effects that usually do not require medical  attention (report to your doctor or health care professional if they continue or are bothersome): -back or bone pain -cough, or throat infection -diarrhea or constipation -dizziness -headache -hot flashes -loss of appetite -nausea -sweating -weakness and tiredness -weight gain This list may not describe all possible side effects. Call your doctor for medical advice about side effects. You may report side effects to FDA at 1-800-FDA-1088. Where should I keep my medicine? Keep out of the reach of children. Store at room temperature between 20 and 25 degrees C (68 and 77 degrees F). Throw away any unused medicine after the expiration date. NOTE: This sheet is a summary. It may not cover all possible information. If you have questions about this medicine, talk to your doctor, pharmacist, or health care provider.    2016, Elsevier/Gold Standard. (2008-01-19 16:31:52)

## 2016-09-28 NOTE — Progress Notes (Signed)
[facility] NOTE  Patient Care Team: Neale Burly, MD as PCP - General (Internal Medicine)  CHIEF COMPLAINTS/PURPOSE OF CONSULTATION:  Invasive ductal Carcinoma R Breast    Breast cancer ([place])   09/08/2016 Mammogram    Postprocedure bilateral mammogram, for clip placements. Bilateral cylinder shaped biopsy clips appear well positioned at the sites of the targeted masses. R breast mass at the 1:00 position, L breast mass at the 8:00 position      09/08/2016 Initial Biopsy    Ultrasound guided biopsy of bilateral breast masses      09/08/2016 Pathology Results    R breast invasive ductal carcinoma, low grade with focal micro-calcifications. 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 ER+ 90%, PR 90% HER 2 IHC 2+, FISH NEGATIVE       HISTORY OF PRESENTING ILLNESS:  Kelly Fisher 82 y.o. female is referred to [facility] by Dr. Tye [place] because of newly diagnosed R breast cancer, ER+ PR+ HER 2 -  Patient states she was checking her body for ticks when she noticed a lump and notified her PCP. Patient had mammogram which revealed right breast mass located at 1 o'clock axis and left breast mass located at the 8 o'clock axis. Kelly Fisher underwent biopsies of left and right masses. Left breast negative for breast cancer. Right breast is invasive ductal ER+ /PR+/HER2 - by [facility]. She notes that she had yearly mammograms until about 10 years ago.   Patient accompanied by husband and brother in law.   She has arthritis and experiences leg pain as a result.   Patient had open heart surgery 5 years ago. She had a right knee replacement about 5 years ago. She fell two years ago and broke left hip. They put a pin in it. She needs a walker to get around.   Her bowels are normal for the most part. She experiences nocturia. She has never experienced difficulty breathing. Every once in a while she experiences pain in her breast.  Patient reports  weight loss. She says she lost weight within the last 6 months. Kelly Fisher reports poor appetite. She has started drinking Ensure. She notes that she is not very active. She and her husband still live together and alone.   Her husband is vocal that he does not want her to have surgery because of how poorly she has done with her past recoveries. He notes that he has to be careful "not to make her mad if he speaks out." but that is his concern. She is not sure if she wants surgery but wants to do what is best.   MEDICAL HISTORY:  Past Medical History:  Diagnosis Date  . CAD (coronary artery disease)   . High cholesterol   . Hypertension     SURGICAL HISTORY: Past Surgical History:  Procedure Laterality Date  . CORONARY ARTERY BYPASS GRAFT    . HIP FRACTURE SURGERY Left   . TOTAL KNEE ARTHROPLASTY Right     SOCIAL HISTORY: Social History   Social History  . Marital status: Married    Spouse name: N/A  . Number of children: N/A  . Years of education: N/A   Occupational History  . Not on file.   Social History Main Topics  . Smoking status: Never Smoker  . Smokeless tobacco: Never Used  . Alcohol use No  . Drug use: No  . Sexual activity: Not on file   Other Topics Concern  .  Not on file   Social History Narrative  . No narrative on file  Married 72 years.  1 child. She is 62 years old. 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 Doesn't drink alcohol Raised in [place] and gardening were her hobbies.  FAMILY HISTORY: History reviewed. No pertinent family history. Mother passed at 80 from a stroke Father passed at 41 from a stroke 2 sisters deceased and 2 brothers deceased. Sisters both passed from stroke. Unsure of brothers' cause of death.  ALLERGIES:  has no allergies on file.  MEDICATIONS:  Current Outpatient Prescriptions  Medication Sig Dispense Refill  . anastrozole (ARIMIDEX) 1 MG tablet Take 1 tablet (1 mg  total) by mouth daily. 90 tablet 0   No current facility-administered medications for this visit.     Review of Systems  Constitutional: Positive for weight loss.       Poor appetite  HENT: Negative.   Eyes: Negative.   Respiratory: Negative.   Cardiovascular: Negative.   Gastrointestinal: Negative.   Genitourinary: Positive for frequency.  Musculoskeletal: Positive for joint pain.  Skin: Negative.   Neurological: Negative.   Endo/Heme/Allergies: Negative.   Psychiatric/Behavioral: Negative.   All other systems reviewed and are negative. 14 point ROS was done and is otherwise as detailed above or in HPI   PHYSICAL EXAMINATION:  ECOG PERFORMANCE STATUS: 2 - Symptomatic, <50% confined to bed  Vitals:   09/28/16 1424 09/28/16 1430  BP:  (!) 156/52  Pulse: (!) 58   Resp: 16   Temp: 98.6 F (37 C)    Filed Weights   09/28/16 1424  Weight: 112 lb 11.2 oz (51.1 kg)    Physical Exam  Constitutional: She is oriented to person, place, and time and well-developed, well-nourished, and in no distress. No distress.  Dentures on top and bottom, thin  HENT:  Head: Normocephalic and atraumatic.  Mouth/Throat: Oropharynx is clear and moist. No oropharyngeal exudate.  Eyes: Conjunctivae and EOM are normal. Pupils are equal, round, and reactive to light. Right eye exhibits no discharge. Left eye exhibits no discharge. No scleral icterus.  Neck: Normal range of motion. Neck supple. No tracheal deviation present. No thyromegaly present.  Cardiovascular: Normal rate and regular rhythm.   Murmur heard. Pulmonary/Chest: Effort normal and breath sounds normal. No respiratory distress. She has no wheezes.  Abdominal: Soft. Bowel sounds are normal. She exhibits no distension and no mass. There is no tenderness. There is no rebound and no guarding.  Musculoskeletal: Normal range of motion. She exhibits no edema.  Lymphadenopathy:    She has no cervical adenopathy.  Neurological: She is  alert and oriented to person, place, and time. No cranial nerve deficit.  Skin: Skin is warm and dry. No rash noted. No erythema.  Psychiatric: Mood, memory, affect and judgment normal.  Nursing note and vitals reviewed.   LABORATORY DATA:  I have reviewed the data as listed No results found for: WBC, HGB, HCT, MCV, PLT CMP  No results found for: NA, K, CL, CO2, GLUCOSE, BUN, CREATININE, CALCIUM, PROT, ALBUMIN, AST, ALT, ALKPHOS, BILITOT, GFRNONAA, GFRAA   RADIOGRAPHIC STUDIES: I have personally reviewed the radiological images as listed and agreed with the findings in the report. Nm Pet Image Initial (pi) Skull Base To Thigh  Result Date: 09/27/2016 CLINICAL DATA:  Initial treatment strategy for breast carcinoma. EXAM: NUCLEAR MEDICINE PET SKULL BASE TO THIGH TECHNIQUE: 5.7 mCi F-18 FDG was injected intravenously. Full-ring PET imaging was  performed from the skull base to thigh after the radiotracer. CT data was obtained and used for attenuation correction and anatomic localization. FASTING BLOOD GLUCOSE:  Value: 86 mg/dl COMPARISON:  None. FINDINGS: NECK No hypermetabolic lymph nodes in the neck. CHEST Small hypermetabolic nodule within the medial aspect of the RIGHT breast with SUV max equal 4.3. Biopsy clip at this site (image 58, series 4 No hypermetabolic axillary lymph nodes There is hypermetabolic activity within a RIGHT middle lobe nodule measuring 8 mm by 7 mm (image 37, series 8) with SUV max equal 3.1. Lesion measures 7 mm x 6 mm on 10/08/2013 CT abdomen). Less defined RIGHT upper lobe nodule measuring is 5 mm (image 15, series 8) without associated metabolic activity. There is metabolic activity associated with the ascending aorta graft repair. There is metabolic activity associated with a to a substernal sternotomy wires. These mediastinal activity is favored inflammatory and not malignant. ABDOMEN/PELVIS No abnormal hypermetabolic activity within the liver, pancreas, adrenal glands,  or spleen. No hypermetabolic lymph nodes in the abdomen or pelvis. Incidental note of small gallstones.  Calcified aorta. Post hysterectomy anatomy. LEFT hip prosthetic. SKELETON No skeletal metastasis.  LEFT hip prosthetic IMPRESSION: 1. Small hypermetabolic mass in the medial RIGHT breast presumed breast carcinoma. 2. No evidence of nodal metastasis within the RIGHT axilla or intrathoracic nodes. 3. Hypermetabolic RIGHT middle lobe pulmonary nodule increased mildly in size from 2014 is favored a low-grade neoplasm or inflammatory nodule. 4. Hypermetabolic activity in the substernal mediastinum and ascending aorta is favored post procedural inflammation. Electronically Signed   By: Suzy Bouchard M.D.   On: 09/27/2016 12:32    ASSESSMENT & PLAN:  Invasive ductal carcinoma of R breast ER+ PR+ HER 2 - Advanced Age RML pulmonary nodule  82 year old female with newly diagnosed R ER+ PR+ HER 2 - infiltrating ductal carcinoma, tumor dimension no larger than 2 cm, easily palpable. Currently patient and her husband are reluctant to do surgery. We discussed options.   We will start her on aromatase inhibitor (arimidex) and continue to monitor. If she tolerates we can discuss DEXA to assess bone density while on AI therapy. Dr. Sherrie Sport ordered a PET/CT given patient's malignancy and weight loss. No evidence of metastatic disease. RML nodule appears to be there since 2014, will monitor going forward.   In regards to her appetite and weight loss she is to continue with Ensure.   Follow up with patient after 4-6 weeks of being on aromatase inhibitor. At follow-up will check labs including vitamin D.    MEDICATIONS PRESCRIBED THIS ENCOUNTER: Meds ordered this encounter  Medications  . anastrozole (ARIMIDEX) 1 MG tablet    Sig: Take 1 tablet (1 mg total) by mouth daily.    Dispense:  90 tablet    Refill:  0   From Up to Date: Most data on the role of primary endocrine therapy come from retrospective  studies. As examples: ?In one series, 33 women with early-stage breast cancer who were unwilling or unfit to undergo surgery opted for endocrine therapy (without surgery) and received letrozole (2.5 mg daily). The median age of patients was 49 years (range, 53 to 98 years). The reasons given for pursuing primary endocrine treatment were frailty (46 percent), comorbidities (29 percent), patient preference (16 percent), and older age (60 percent). Treatment with letrozole resulted in : .An overall response rate of 82 percent with median time to initial response of four months (range, 2 to 24 months) and median time  to best response of nine months (range, 3 to 50 months). The median overall survival was 51 months (range, 4 to 103 months). .A 12 percent incidence of fractures during treatment, most commonly involving the femur or wrist. .Treatment was stopped due to disease progression in 18 percent. During a median follow-up of 56 months, 42 patients (40 percent) died, though only 6 (22 percent) died of breast cancer.  If able, we suggest that medically frail patients proceed with surgery  .We suggest observation rather than adjuvant treatment following surgery, especially if their life expectancy is limited. .For patients who desire subsequent treatment, adjuvant therapy may be administered depending on the baseline health of the patient. Alternative regimens and dosing schedules may be preferable for certain patients given improved tolerability relative to standard treatments. ?For medically frail women with breast cancer who are not candidates for surgery (including those who decline surgery), we take the patient's tumor characteristics and the presence of local symptoms into consideration. (See 'Medically frail' above.) .For patients with hormone receptor-positive breast cancer, we suggest primary endocrine therapy rather than chemotherapy or observation   This document serves as a record of services  personally performed by Ancil Linsey, MD. It was created on her behalf by Elmyra Ricks, a trained medical scribe. The creation of this record is based on the scribe's personal observations and the provider's statements to them. This document has been checked and approved by the attending provider.  I have reviewed the above documentation for accuracy and completeness and I agree with the above.  This note was electronically signed.   Molli Hazard, MD  09/28/2016 3:21 PM

## 2016-09-29 ENCOUNTER — Encounter (HOSPITAL_COMMUNITY): Payer: Self-pay | Admitting: Hematology & Oncology

## 2016-10-22 DIAGNOSIS — I10 Essential (primary) hypertension: Secondary | ICD-10-CM | POA: Diagnosis not present

## 2016-10-22 DIAGNOSIS — M1009 Idiopathic gout, multiple sites: Secondary | ICD-10-CM | POA: Diagnosis not present

## 2016-10-22 DIAGNOSIS — K210 Gastro-esophageal reflux disease with esophagitis: Secondary | ICD-10-CM | POA: Diagnosis not present

## 2016-10-22 DIAGNOSIS — E784 Other hyperlipidemia: Secondary | ICD-10-CM | POA: Diagnosis not present

## 2016-10-22 DIAGNOSIS — E1165 Type 2 diabetes mellitus with hyperglycemia: Secondary | ICD-10-CM | POA: Diagnosis not present

## 2016-10-29 ENCOUNTER — Ambulatory Visit (HOSPITAL_COMMUNITY): Payer: Medicare Other | Admitting: Hematology & Oncology

## 2016-11-12 DIAGNOSIS — E784 Other hyperlipidemia: Secondary | ICD-10-CM | POA: Diagnosis not present

## 2016-11-12 DIAGNOSIS — M1009 Idiopathic gout, multiple sites: Secondary | ICD-10-CM | POA: Diagnosis not present

## 2016-11-12 DIAGNOSIS — K210 Gastro-esophageal reflux disease with esophagitis: Secondary | ICD-10-CM | POA: Diagnosis not present

## 2016-11-12 DIAGNOSIS — E1165 Type 2 diabetes mellitus with hyperglycemia: Secondary | ICD-10-CM | POA: Diagnosis not present

## 2016-11-12 DIAGNOSIS — I10 Essential (primary) hypertension: Secondary | ICD-10-CM | POA: Diagnosis not present

## 2016-11-23 DIAGNOSIS — I10 Essential (primary) hypertension: Secondary | ICD-10-CM | POA: Diagnosis not present

## 2016-11-23 DIAGNOSIS — E119 Type 2 diabetes mellitus without complications: Secondary | ICD-10-CM | POA: Diagnosis not present

## 2016-11-26 ENCOUNTER — Ambulatory Visit (HOSPITAL_COMMUNITY): Payer: Medicare Other | Admitting: Hematology & Oncology

## 2016-12-07 DIAGNOSIS — E1151 Type 2 diabetes mellitus with diabetic peripheral angiopathy without gangrene: Secondary | ICD-10-CM | POA: Diagnosis not present

## 2016-12-07 DIAGNOSIS — E1142 Type 2 diabetes mellitus with diabetic polyneuropathy: Secondary | ICD-10-CM | POA: Diagnosis not present

## 2016-12-21 ENCOUNTER — Encounter (HOSPITAL_COMMUNITY): Payer: Self-pay

## 2016-12-21 ENCOUNTER — Encounter (HOSPITAL_COMMUNITY): Payer: Medicare Other | Attending: Oncology | Admitting: Oncology

## 2016-12-21 ENCOUNTER — Encounter (HOSPITAL_COMMUNITY): Payer: Medicare Other

## 2016-12-21 VITALS — BP 165/51 | HR 60 | Temp 98.0°F | Resp 18 | Wt 117.4 lb

## 2016-12-21 DIAGNOSIS — Z170 Estrogen receptor positive status [ER+]: Secondary | ICD-10-CM | POA: Insufficient documentation

## 2016-12-21 DIAGNOSIS — C50211 Malignant neoplasm of upper-inner quadrant of right female breast: Secondary | ICD-10-CM

## 2016-12-21 DIAGNOSIS — Z79811 Long term (current) use of aromatase inhibitors: Secondary | ICD-10-CM

## 2016-12-21 LAB — CBC WITH DIFFERENTIAL/PLATELET
Basophils Absolute: 0 10*3/uL (ref 0.0–0.1)
Basophils Relative: 0 %
EOS PCT: 4 %
Eosinophils Absolute: 0.2 10*3/uL (ref 0.0–0.7)
HEMATOCRIT: 34.5 % — AB (ref 36.0–46.0)
Hemoglobin: 11.1 g/dL — ABNORMAL LOW (ref 12.0–15.0)
LYMPHS ABS: 1.1 10*3/uL (ref 0.7–4.0)
Lymphocytes Relative: 23 %
MCH: 30.5 pg (ref 26.0–34.0)
MCHC: 32.2 g/dL (ref 30.0–36.0)
MCV: 94.8 fL (ref 78.0–100.0)
MONO ABS: 0.4 10*3/uL (ref 0.1–1.0)
Monocytes Relative: 9 %
NEUTROS ABS: 2.9 10*3/uL (ref 1.7–7.7)
Neutrophils Relative %: 64 %
PLATELETS: 212 10*3/uL (ref 150–400)
RBC: 3.64 MIL/uL — ABNORMAL LOW (ref 3.87–5.11)
RDW: 15.4 % (ref 11.5–15.5)
WBC: 4.6 10*3/uL (ref 4.0–10.5)

## 2016-12-21 LAB — COMPREHENSIVE METABOLIC PANEL
ALT: 10 U/L — AB (ref 14–54)
AST: 18 U/L (ref 15–41)
Albumin: 3.7 g/dL (ref 3.5–5.0)
Alkaline Phosphatase: 50 U/L (ref 38–126)
Anion gap: 7 (ref 5–15)
BILIRUBIN TOTAL: 0.7 mg/dL (ref 0.3–1.2)
BUN: 13 mg/dL (ref 6–20)
CO2: 29 mmol/L (ref 22–32)
CREATININE: 0.66 mg/dL (ref 0.44–1.00)
Calcium: 9.5 mg/dL (ref 8.9–10.3)
Chloride: 103 mmol/L (ref 101–111)
Glucose, Bld: 85 mg/dL (ref 65–99)
POTASSIUM: 4.2 mmol/L (ref 3.5–5.1)
Sodium: 139 mmol/L (ref 135–145)
TOTAL PROTEIN: 6.9 g/dL (ref 6.5–8.1)

## 2016-12-21 MED ORDER — CALCIUM CARBONATE-VITAMIN D 500-200 MG-UNIT PO TABS: 2.0000 | ORAL_TABLET | Freq: Every day | ORAL | 5 refills | Status: AC

## 2016-12-21 NOTE — Patient Instructions (Signed)
[place] at [facility] Discharge Instructions  RECOMMENDATIONS MADE BY THE CONSULTANT AND ANY TEST RESULTS WILL BE SENT TO YOUR REFERRING PHYSICIAN.  You were seen today by Dr. Barron Schmid We will schedule you for your Dexa scan Lab work today and we will call you with the results Follow up in 3 months with lab work   Thank you for choosing [place] at [facility] to provide your oncology and hematology care.  To afford each patient quality time with our provider, please arrive at least 15 minutes before your scheduled appointment time.    If you have a lab appointment with the [place] please come in thru the  Main Entrance and check in at the main information desk  You need to re-schedule your appointment should you arrive 10 or more minutes late.  We strive to give you quality time with our providers, and arriving late affects you and other patients whose appointments are after yours.  Also, if you no show three or more times for appointments you may be dismissed from the clinic at the providers discretion.     Again, thank you for choosing [facility].  Our hope is that these requests will decrease the amount of time that you wait before being seen by our physicians.       _____________________________________________________________  Should you have questions after your visit to [facility], please contact our office at [phone] between the hours of 8:30 a.m. and 4:30 p.m.  Voicemails left after 4:30 p.m. will not be returned until the following business day.  For prescription refill requests, have your pharmacy contact our office.       Resources For Cancer Patients and their Caregivers ? American Cancer Society: Can assist with transportation, wigs, general needs, runs Look Good Feel Better.        ([phone] 514-1257 ? Cancer Care: Provides financial assistance, online support groups,  medication/co-pay assistance.  1-800-813-HOPE 539-197-8009) ? [place] Assists [place] cancer patients and their families through emotional , educational and financial support.  (430) 072-0639 ? [place] DSS Where to apply for food stamps, Medicaid and utility assistance. 878-618-3336 ? [employer]: Transportation to medical appointments. 239-158-6965 ? Social Security Administration: May apply for disability if have a Stage IV cancer. 301-715-8758 [phone]) [phone] ? [employer], Disability and Transit Services: Assists with nutrition, care and transit needs. [place] Support Programs: @10RELATIVEDAYS @ > Cancer Support Group  2nd Tuesday of the month 1pm-2pm, Journey Room  > Creative Journey  3rd Tuesday of the month 1130am-1pm, Journey Room  > Look Good Feel Better  1st Wednesday of the month 10am-12 noon, Journey Room (Call [place] to register [phone])

## 2016-12-21 NOTE — Progress Notes (Signed)
[name]  Progress Note  Patient Care Team: Neale Burly, MD as PCP - General (Internal Medicine)  CHIEF COMPLAINTS/PURPOSE OF CONSULTATION:  Invasive ductal Carcinoma R Breast    Malignant neoplasm of upper-inner quadrant of right breast in female, estrogen receptor positive ([place])   09/08/2016 Mammogram    Postprocedure bilateral mammogram, for clip placements. Bilateral cylinder shaped biopsy clips appear well positioned at the sites of the targeted masses. R breast mass at the 1:00 position, L breast mass at the 8:00 position      09/08/2016 Initial Biopsy    Ultrasound guided biopsy of bilateral breast masses      09/08/2016 Pathology Results    R breast invasive ductal carcinoma, low grade with focal micro-calcifications. 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 ER+ 90%, PR 90% HER 2 IHC 2+, FISH NEGATIVE       HISTORY OF PRESENTING ILLNESS:  Kelly Fisher 83 y.o. female is referred to [facility] by Dr. Tye Fisher because of newly diagnosed R breast cancer, ER+ PR+ HER 2 -  Patient states she was checking her body for ticks when she noticed a lump and notified her PCP. Patient had mammogram which revealed right breast mass located at 1 o'clock axis and left breast mass located at the 8 o'clock axis. [facility] underwent biopsies of left and right masses. Left breast negative for breast cancer. Right breast is invasive ductal ER+ /PR+/HER2 - by [facility]. She notes that she had yearly mammograms until about 10 years ago.    Patient presents to the clinic today for right breast cancer follow up.  Patient is current taking Arimidex without any side effects.  States overall she is doing well, but she had 2 episodes of falling last week at home.  She denies any  fatigue, hot flashes, nausea, weight loss, sob, abdominal pain.     MEDICAL HISTORY:  Past Medical History:  Diagnosis Date  . CAD (coronary artery disease)   . High  cholesterol   . Hypertension     SURGICAL HISTORY: Past Surgical History:  Procedure Laterality Date  . CORONARY ARTERY BYPASS GRAFT    . HIP FRACTURE SURGERY Left   . TOTAL KNEE ARTHROPLASTY Right     SOCIAL HISTORY: Social History   Social History  . Marital status: Married    Spouse name: N/A  . Number of children: N/A  . Years of education: N/A   Occupational History  . Not on file.   Social History Main Topics  . Smoking status: Never Smoker  . Smokeless tobacco: Never Used  . Alcohol use No  . Drug use: No  . Sexual activity: Not on file   Other Topics Concern  . Not on file   Social History Narrative  . No narrative on file  Married 72 years.  1 child. She is 50 years old. 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 Doesn't drink alcohol Raised in [facility] and gardening were her hobbies.  FAMILY HISTORY: History reviewed. No pertinent family history. Mother passed at 27 from a stroke Father passed at 72 from a stroke 2 sisters deceased and 2 brothers deceased. Sisters both passed from stroke. Unsure of brothers' cause of death.  ALLERGIES:  has no allergies on file.  MEDICATIONS:  Current Outpatient Prescriptions  Medication Sig Dispense Refill  . allopurinol (ZYLOPRIM) 300 MG tablet Take 300 mg by mouth daily.    Marland Kitchen  anastrozole (ARIMIDEX) 1 MG tablet Take 1 tablet (1 mg total) by mouth daily. 90 tablet 0  . aspirin EC 81 MG tablet Take 81 mg by mouth daily.    . calcium-vitamin D (OSCAL WITH D) 500-200 MG-UNIT tablet Take 2 tablets by mouth daily with breakfast. 60 tablet 5  . clopidogrel (PLAVIX) 75 MG tablet Take 75 mg by mouth daily.    . ferrous sulfate 325 (65 FE) MG tablet Take 325 mg by mouth daily with breakfast.    . linagliptin (TRADJENTA) 5 MG TABS tablet Take 5 mg by mouth daily.    Marland Kitchen lisinopril (PRINIVIL,ZESTRIL) 5 MG tablet Take 5 mg by mouth daily.    . metFORMIN (GLUCOPHAGE) 500 MG  tablet Take 1,000 mg by mouth 2 (two) times daily.    . metoprolol tartrate (LOPRESSOR) 25 MG tablet Take 25 mg by mouth daily.    . pantoprazole (PROTONIX) 40 MG tablet Take 40 mg by mouth daily.    . simvastatin (ZOCOR) 40 MG tablet Take 40 mg by mouth.    . triamcinolone lotion (KENALOG) 0.1 % Apply 1 application topically 2 (two) times daily.     No current facility-administered medications for this visit.     Review of Systems  Constitutional: Negative for malaise/fatigue and weight loss.       Denies hot flashes  HENT: Negative.   Eyes: Negative.   Respiratory: Negative.  Negative for shortness of breath.   Cardiovascular: Negative.   Gastrointestinal: Negative.  Negative for abdominal pain.  Musculoskeletal: Positive for falls (2 episodes last week at home).  Skin: Negative.   Neurological: Negative.   Endo/Heme/Allergies: Negative.   Psychiatric/Behavioral: Negative.   All other systems reviewed and are negative. 14 point ROS was done and is otherwise as detailed above or in HPI   PHYSICAL EXAMINATION:  ECOG PERFORMANCE STATUS: 1  Vitals:   12/21/16 0948  BP: (!) 165/51  Pulse: 60  Resp: 18  Temp: 98 F (36.7 C)   Filed Weights   12/21/16 0948  Weight: 117 lb 6.4 oz (53.3 kg)     Physical Exam  Constitutional: She is oriented to person, place, and time and well-developed, well-nourished, and in no distress. No distress.  HENT:  Head: Normocephalic and atraumatic.  Mouth/Throat: Oropharynx is clear and moist. No oropharyngeal exudate.  Eyes: Conjunctivae and EOM are normal. Pupils are equal, round, and reactive to light. Right eye exhibits no discharge. Left eye exhibits no discharge. No scleral icterus.  Neck: Normal range of motion. Neck supple. No tracheal deviation present. No thyromegaly present.  Cardiovascular: Normal rate, regular rhythm and normal heart sounds.   Pulmonary/Chest: Effort normal and breath sounds normal. No respiratory distress. She  has no wheezes.    Abdominal: Soft. Bowel sounds are normal. She exhibits no distension and no mass. There is no tenderness. There is no rebound and no guarding.  Musculoskeletal: Normal range of motion. She exhibits no edema.  Lymphadenopathy:    She has no cervical adenopathy.  Neurological: She is alert and oriented to person, place, and time. No cranial nerve deficit. Gait normal.  Skin: Skin is warm and dry. No rash noted. No erythema.  Psychiatric: Mood, memory, affect and judgment normal.  Nursing note and vitals reviewed.   LABORATORY DATA:  I have reviewed the data as listed No results found for: WBC, HGB, HCT, MCV, PLT CMP  No results found for: NA, K, CL, CO2, GLUCOSE, BUN, CREATININE, CALCIUM, PROT, ALBUMIN, AST, ALT,  ALKPHOS, BILITOT, GFRNONAA, GFRAA   RADIOGRAPHIC STUDIES: I have personally reviewed the radiological images as listed and agreed with the findings in the report. No results found.  Study Results  NUCLEAR MEDICINE PET SKULL BASE TO THIGH 09/27/2016  IMPRESSION: 1. Small hypermetabolic mass in the medial RIGHT breast presumed breast carcinoma. 2. No evidence of nodal metastasis within the RIGHT axilla or intrathoracic nodes. 3. Hypermetabolic RIGHT middle lobe pulmonary nodule increased mildly in size from 2014 is favored a low-grade neoplasm or inflammatory nodule. 4. Hypermetabolic activity in the substernal mediastinum and ascending aorta is favored post procedural inflammation.   ASSESSMENT & PLAN:  Invasive ductal carcinoma of R breast ER+ PR+ HER 2 - Advanced Age RML pulmonary nodule  83 year old female with newly diagnosed Right ER+ PR+ HER 2 - infiltrating ductal carcinoma, tumor dimension no larger than 2 cm, easily palpable. Currently patient and her husband are reluctant to do surgery. We discussed options.   MEDICATIONS PRESCRIBED THIS ENCOUNTER: Meds ordered this encounter  Medications  . calcium-vitamin D (OSCAL WITH D) 500-200  MG-UNIT tablet    Sig: Take 2 tablets by mouth daily with breakfast.    Dispense:  60 tablet    Refill:  5   Labs reviewed and scans reviewed. Results noted above.  Continue arimidex for treatment of right breast cancer. Tolerating endocrine therapy well.   Patient and husband has refused lumpectomy of her right breast mass at this time.  I will check her CBC , CMP, vitamin D level today and on her next visit.   I ordered a DEXA scan today to check her baseline bone density.   I prescribed her  Calcium- vitamin D tablet to take with her arimidex to prevent osteoporosis.   RTC in 3 months for follow up.    This document serves as a record of services personally performed by Twana First, MD. It was created on her behalf by Shirlean Mylar, a trained medical scribe. The creation of this record is based on the scribe's personal observations and the provider's statements to them. This document has been checked and approved by the attending provider.   I have reviewed the above documentation for accuracy and completeness and I agree with the above. Twana First, MD  This note was electronically signed.   [employer]  12/21/2016 10:28 AM

## 2016-12-22 LAB — VITAMIN D 25 HYDROXY (VIT D DEFICIENCY, FRACTURES): Vit D, 25-Hydroxy: 14.9 ng/mL — ABNORMAL LOW (ref 30.0–100.0)

## 2017-01-03 ENCOUNTER — Ambulatory Visit (HOSPITAL_COMMUNITY)
Admission: RE | Admit: 2017-01-03 | Discharge: 2017-01-03 | Disposition: A | Discharge status: Home / Self Care | Payer: Medicare Other | Source: Ambulatory Visit | Attending: Oncology | Admitting: Oncology

## 2017-01-03 ENCOUNTER — Encounter (HOSPITAL_COMMUNITY): Payer: Self-pay | Admitting: Radiology

## 2017-01-03 DIAGNOSIS — C50211 Malignant neoplasm of upper-inner quadrant of right female breast: Secondary | ICD-10-CM | POA: Insufficient documentation

## 2017-01-03 DIAGNOSIS — Z780 Asymptomatic menopausal state: Secondary | ICD-10-CM | POA: Diagnosis not present

## 2017-01-03 DIAGNOSIS — Z170 Estrogen receptor positive status [ER+]: Secondary | ICD-10-CM | POA: Diagnosis not present

## 2017-01-03 DIAGNOSIS — M810 Age-related osteoporosis without current pathological fracture: Secondary | ICD-10-CM | POA: Insufficient documentation

## 2017-01-07 ENCOUNTER — Other Ambulatory Visit (HOSPITAL_COMMUNITY): Payer: Self-pay | Admitting: *Deleted

## 2017-01-07 ENCOUNTER — Other Ambulatory Visit (HOSPITAL_COMMUNITY): Payer: Self-pay | Admitting: Oncology

## 2017-01-07 DIAGNOSIS — M810 Age-related osteoporosis without current pathological fracture: Secondary | ICD-10-CM | POA: Insufficient documentation

## 2017-01-07 DIAGNOSIS — Z170 Estrogen receptor positive status [ER+]: Principal | ICD-10-CM

## 2017-01-07 DIAGNOSIS — E119 Type 2 diabetes mellitus without complications: Secondary | ICD-10-CM | POA: Diagnosis not present

## 2017-01-07 DIAGNOSIS — C50211 Malignant neoplasm of upper-inner quadrant of right female breast: Secondary | ICD-10-CM

## 2017-01-07 DIAGNOSIS — I10 Essential (primary) hypertension: Secondary | ICD-10-CM | POA: Diagnosis not present

## 2017-01-14 ENCOUNTER — Encounter (HOSPITAL_COMMUNITY): Payer: Medicare Other

## 2017-01-14 ENCOUNTER — Encounter (HOSPITAL_COMMUNITY): Payer: Medicare Other | Attending: Oncology

## 2017-01-14 VITALS — BP 122/43 | HR 56 | Temp 98.6°F | Resp 18

## 2017-01-14 DIAGNOSIS — C50211 Malignant neoplasm of upper-inner quadrant of right female breast: Secondary | ICD-10-CM

## 2017-01-14 DIAGNOSIS — M810 Age-related osteoporosis without current pathological fracture: Secondary | ICD-10-CM

## 2017-01-14 DIAGNOSIS — Z170 Estrogen receptor positive status [ER+]: Secondary | ICD-10-CM | POA: Insufficient documentation

## 2017-01-14 LAB — COMPREHENSIVE METABOLIC PANEL
ALT: 11 U/L — AB (ref 14–54)
ANION GAP: 6 (ref 5–15)
AST: 19 U/L (ref 15–41)
Albumin: 3.6 g/dL (ref 3.5–5.0)
Alkaline Phosphatase: 50 U/L (ref 38–126)
BUN: 15 mg/dL (ref 6–20)
CHLORIDE: 104 mmol/L (ref 101–111)
CO2: 30 mmol/L (ref 22–32)
Calcium: 9.5 mg/dL (ref 8.9–10.3)
Creatinine, Ser: 0.67 mg/dL (ref 0.44–1.00)
GFR calc Af Amer: >60 mL/min (ref >60)
Glucose, Bld: 132 mg/dL — ABNORMAL HIGH (ref 65–99)
Potassium: 4.5 mmol/L (ref 3.5–5.1)
SODIUM: 140 mmol/L (ref 135–145)
Total Bilirubin: 0.5 mg/dL (ref 0.3–1.2)
Total Protein: 6.7 g/dL (ref 6.5–8.1)

## 2017-01-14 MED ORDER — DENOSUMAB 60 MG/ML ~~LOC~~ SOLN
60.0000 mg | Freq: Once | SUBCUTANEOUS | Status: AC
  Administered 2017-01-14: 60 mg via SUBCUTANEOUS
  Filled 2017-01-14: qty 1

## 2017-01-14 NOTE — Patient Instructions (Signed)
[place] at [facility] Discharge Instructions  RECOMMENDATIONS MADE BY THE CONSULTANT AND ANY TEST RESULTS WILL BE SENT TO YOUR REFERRING PHYSICIAN.  Prolia 60 mg injection given today as ordered. Injection will be every 6 months with lab work prior to injection. Return as scheduled.  Denosumab injection What is this medicine? DENOSUMAB (den oh sue mab) slows bone breakdown. Prolia is used to treat osteoporosis in women after menopause and in men. Delton See is used to prevent bone fractures and other bone problems caused by cancer bone metastases. Delton See is also used to treat giant cell tumor of the bone. COMMON BRAND NAME(S): Prolia, XGEVA What should I tell my health care provider before I take this medicine? They need to know if you have any of these conditions: -dental disease -eczema -infection or history of infections -kidney disease or on dialysis -low blood calcium or vitamin D -malabsorption syndrome -scheduled to have surgery or tooth extraction -taking medicine that contains denosumab -thyroid or parathyroid disease -an unusual reaction to denosumab, other medicines, foods, dyes, or preservatives -pregnant or trying to get pregnant -breast-feeding How should I use this medicine? This medicine is for injection under the skin. It is given by a health care professional in a hospital or clinic setting. If you are getting Prolia, a special MedGuide will be given to you by the pharmacist with each prescription and refill. Be sure to read this information carefully each time. For Prolia, talk to your pediatrician regarding the use of this medicine in children. Special care may be needed. For Delton See, talk to your pediatrician regarding the use of this medicine in children. While this drug may be prescribed for children as young as 13 years for selected conditions, precautions do apply. What if I miss a dose? It is important not to miss your dose. Call your  doctor or health care professional if you are unable to keep an appointment. What may interact with this medicine? Do not take this medicine with any of the following medications: -other medicines containing denosumab This medicine may also interact with the following medications: -medicines that suppress the immune system -medicines that treat cancer -steroid medicines like prednisone or cortisone What should I watch for while using this medicine? Visit your doctor or health care professional for regular checks on your progress. Your doctor or health care professional may order blood tests and other tests to see how you are doing. Call your doctor or health care professional if you get a cold or other infection while receiving this medicine. Do not treat yourself. This medicine may decrease your body's ability to fight infection. You should make sure you get enough calcium and vitamin D while you are taking this medicine, unless your doctor tells you not to. Discuss the foods you eat and the vitamins you take with your health care professional. See your dentist regularly. Brush and floss your teeth as directed. Before you have any dental work done, tell your dentist you are receiving this medicine. Do not become pregnant while taking this medicine or for 5 months after stopping it. Women should inform their doctor if they wish to become pregnant or think they might be pregnant. There is a potential for serious side effects to an unborn child. Talk to your health care professional or pharmacist for more information. What side effects may I notice from receiving this medicine? Side effects that you should report to your doctor or health care professional as soon as possible: -allergic reactions  like skin rash, itching or hives, swelling of the face, lips, or tongue -breathing problems -chest pain -fast, irregular heartbeat -feeling faint or lightheaded, falls -fever, chills, or any other sign of  infection -muscle spasms, tightening, or twitches -numbness or tingling -skin blisters or bumps, or is dry, peels, or red -slow healing or unexplained pain in the mouth or jaw -unusual bleeding or bruising Side effects that usually do not require medical attention (report to your doctor or health care professional if they continue or are bothersome): -muscle pain -stomach upset, gas Where should I keep my medicine? This medicine is only given in a clinic, doctor's office, or other health care setting and will not be stored at home.  2017 Elsevier/Gold Standard (2015-12-11 10:06:55)   Thank you for choosing [place] at [facility] to provide your oncology and hematology care.  To afford each patient quality time with our provider, please arrive at least 15 minutes before your scheduled appointment time.    If you have a lab appointment with the [facility] please come in thru the  Main Entrance and check in at the main information desk  You need to re-schedule your appointment should you arrive 10 or more minutes late.  We strive to give you quality time with our providers, and arriving late affects you and other patients whose appointments are after yours.  Also, if you no show three or more times for appointments you may be dismissed from the clinic at the providers discretion.     Again, thank you for choosing [facility].  Our hope is that these requests will decrease the amount of time that you wait before being seen by our physicians.       _____________________________________________________________  Should you have questions after your visit to [facility], please contact our office at [phone] between the hours of 8:30 a.m. and 4:30 p.m.  Voicemails left after 4:30 p.m. will not be returned until the following business day.  For prescription refill requests, have your pharmacy contact our office.       Resources For  Cancer Patients and their Caregivers ? American Cancer Society: Can assist with transportation, wigs, general needs, runs Look Good Feel Better.        240-034-7365 ? Cancer Care: Provides financial assistance, online support groups, medication/co-pay assistance.  1-800-813-HOPE 587-447-1467) ? [place] Assists [place] cancer patients and their families through emotional , educational and financial support.  (954)477-2057 ? [place] DSS Where to apply for food stamps, Medicaid and utility assistance. 785-341-2379 ? [facility]: Transportation to medical appointments. 850-135-7910 ? Social Security Administration: May apply for disability if have a Stage IV cancer. (272)219-4809 5632444631 ? [employer], Disability and Transit Services: Assists with nutrition, care and transit needs. [place] Support Programs: @10RELATIVEDAYS @ > Cancer Support Group  2nd Tuesday of the month 1pm-2pm, Journey Room  > Creative Journey  3rd Tuesday of the month 1130am-1pm, Journey Room  > Look Good Feel Better  1st Wednesday of the month 10am-12 noon, Journey Room (Call [place] to register [phone])

## 2017-01-14 NOTE — Progress Notes (Signed)
Kelly Fisher presents today for injection per MD orders. Prolia 60 mg administered SQ in right Upper Arm. Administration without incident. Patient tolerated well.

## 2017-02-04 DIAGNOSIS — E119 Type 2 diabetes mellitus without complications: Secondary | ICD-10-CM | POA: Diagnosis not present

## 2017-02-04 DIAGNOSIS — I10 Essential (primary) hypertension: Secondary | ICD-10-CM | POA: Diagnosis not present

## 2017-02-15 DIAGNOSIS — I10 Essential (primary) hypertension: Secondary | ICD-10-CM | POA: Diagnosis not present

## 2017-02-15 DIAGNOSIS — M1009 Idiopathic gout, multiple sites: Secondary | ICD-10-CM | POA: Diagnosis not present

## 2017-02-15 DIAGNOSIS — E1165 Type 2 diabetes mellitus with hyperglycemia: Secondary | ICD-10-CM | POA: Diagnosis not present

## 2017-02-15 DIAGNOSIS — K210 Gastro-esophageal reflux disease with esophagitis: Secondary | ICD-10-CM | POA: Diagnosis not present

## 2017-02-15 DIAGNOSIS — E784 Other hyperlipidemia: Secondary | ICD-10-CM | POA: Diagnosis not present

## 2017-02-22 DIAGNOSIS — E1151 Type 2 diabetes mellitus with diabetic peripheral angiopathy without gangrene: Secondary | ICD-10-CM | POA: Diagnosis not present

## 2017-02-22 DIAGNOSIS — E1140 Type 2 diabetes mellitus with diabetic neuropathy, unspecified: Secondary | ICD-10-CM | POA: Diagnosis not present

## 2017-03-17 DIAGNOSIS — E119 Type 2 diabetes mellitus without complications: Secondary | ICD-10-CM | POA: Diagnosis not present

## 2017-03-17 DIAGNOSIS — I10 Essential (primary) hypertension: Secondary | ICD-10-CM | POA: Diagnosis not present

## 2017-03-22 ENCOUNTER — Ambulatory Visit (HOSPITAL_COMMUNITY): Payer: PRIVATE HEALTH INSURANCE

## 2017-03-22 ENCOUNTER — Other Ambulatory Visit (HOSPITAL_COMMUNITY): Payer: PRIVATE HEALTH INSURANCE

## 2017-03-30 ENCOUNTER — Encounter (HOSPITAL_COMMUNITY): Payer: Medicare Other | Attending: Oncology

## 2017-03-30 ENCOUNTER — Encounter (HOSPITAL_COMMUNITY): Payer: Self-pay | Admitting: Oncology

## 2017-03-30 ENCOUNTER — Encounter (HOSPITAL_BASED_OUTPATIENT_CLINIC_OR_DEPARTMENT_OTHER): Payer: Medicare Other | Admitting: Oncology

## 2017-03-30 VITALS — BP 141/52 | HR 61 | Temp 98.5°F | Resp 16 | Wt 115.2 lb

## 2017-03-30 DIAGNOSIS — Z79899 Other long term (current) drug therapy: Secondary | ICD-10-CM | POA: Insufficient documentation

## 2017-03-30 DIAGNOSIS — Z7902 Long term (current) use of antithrombotics/antiplatelets: Secondary | ICD-10-CM | POA: Insufficient documentation

## 2017-03-30 DIAGNOSIS — Z7982 Long term (current) use of aspirin: Secondary | ICD-10-CM | POA: Diagnosis not present

## 2017-03-30 DIAGNOSIS — I2510 Atherosclerotic heart disease of native coronary artery without angina pectoris: Secondary | ICD-10-CM | POA: Diagnosis not present

## 2017-03-30 DIAGNOSIS — R911 Solitary pulmonary nodule: Secondary | ICD-10-CM | POA: Diagnosis not present

## 2017-03-30 DIAGNOSIS — Z79811 Long term (current) use of aromatase inhibitors: Secondary | ICD-10-CM

## 2017-03-30 DIAGNOSIS — C50211 Malignant neoplasm of upper-inner quadrant of right female breast: Secondary | ICD-10-CM | POA: Insufficient documentation

## 2017-03-30 DIAGNOSIS — Z7984 Long term (current) use of oral hypoglycemic drugs: Secondary | ICD-10-CM | POA: Insufficient documentation

## 2017-03-30 DIAGNOSIS — E7800 Pure hypercholesterolemia, unspecified: Secondary | ICD-10-CM | POA: Insufficient documentation

## 2017-03-30 DIAGNOSIS — M818 Other osteoporosis without current pathological fracture: Secondary | ICD-10-CM | POA: Diagnosis not present

## 2017-03-30 DIAGNOSIS — Z170 Estrogen receptor positive status [ER+]: Secondary | ICD-10-CM | POA: Diagnosis not present

## 2017-03-30 DIAGNOSIS — I10 Essential (primary) hypertension: Secondary | ICD-10-CM | POA: Insufficient documentation

## 2017-03-30 LAB — CBC WITH DIFFERENTIAL/PLATELET
BASOS ABS: 0 10*3/uL (ref 0.0–0.1)
Basophils Relative: 1 %
Eosinophils Absolute: 0.1 10*3/uL (ref 0.0–0.7)
Eosinophils Relative: 3 %
HEMATOCRIT: 34.7 % — AB (ref 36.0–46.0)
Hemoglobin: 11.1 g/dL — ABNORMAL LOW (ref 12.0–15.0)
LYMPHS PCT: 29 %
Lymphs Abs: 1.3 10*3/uL (ref 0.7–4.0)
MCH: 29.4 pg (ref 26.0–34.0)
MCHC: 32 g/dL (ref 30.0–36.0)
MCV: 92 fL (ref 78.0–100.0)
MONO ABS: 0.4 10*3/uL (ref 0.1–1.0)
Monocytes Relative: 8 %
NEUTROS ABS: 2.6 10*3/uL (ref 1.7–7.7)
Neutrophils Relative %: 59 %
Platelets: 167 10*3/uL (ref 150–400)
RBC: 3.77 MIL/uL — AB (ref 3.87–5.11)
RDW: 15.2 % (ref 11.5–15.5)
WBC: 4.4 10*3/uL (ref 4.0–10.5)

## 2017-03-30 LAB — COMPREHENSIVE METABOLIC PANEL
ALK PHOS: 48 U/L (ref 38–126)
ALT: 11 U/L — ABNORMAL LOW (ref 14–54)
ANION GAP: 8 (ref 5–15)
AST: 23 U/L (ref 15–41)
Albumin: 3.5 g/dL (ref 3.5–5.0)
BILIRUBIN TOTAL: 0.6 mg/dL (ref 0.3–1.2)
BUN: 11 mg/dL (ref 6–20)
CALCIUM: 9.5 mg/dL (ref 8.9–10.3)
CO2: 27 mmol/L (ref 22–32)
CREATININE: 0.68 mg/dL (ref 0.44–1.00)
Chloride: 105 mmol/L (ref 101–111)
GFR calc non Af Amer: >60 mL/min (ref >60)
GLUCOSE: 82 mg/dL (ref 65–99)
Potassium: 4.4 mmol/L (ref 3.5–5.1)
Sodium: 140 mmol/L (ref 135–145)
TOTAL PROTEIN: 6.8 g/dL (ref 6.5–8.1)

## 2017-03-30 NOTE — Progress Notes (Signed)
[place]  Progress Note  Patient Care Team: Neale Burly, MD as PCP - General (Internal Medicine)  CHIEF COMPLAINTS/PURPOSE OF CONSULTATION:  Invasive ductal Carcinoma R Breast    Malignant neoplasm of upper-inner quadrant of right breast in female, estrogen receptor positive ([place])   09/08/2016 Mammogram    Postprocedure bilateral mammogram, for clip placements. Bilateral cylinder shaped biopsy clips appear well positioned at the sites of the targeted masses. R breast mass at the 1:00 position, L breast mass at the 8:00 position      09/08/2016 Initial Biopsy    Ultrasound guided biopsy of bilateral breast masses      09/08/2016 Pathology Results    R breast invasive ductal carcinoma, low grade with focal micro-calcifications. 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 ER+ 90%, PR 90% HER 2 IHC 2+, FISH NEGATIVE       HISTORY OF PRESENTING ILLNESS:  Kelly Fisher 83 y.o. female is referred to [facility] by Dr. Tye Maryland because of newly diagnosed R breast cancer, ER+ PR+ HER 2 -  Patient states she was checking her body for ticks when she noticed a lump and notified her PCP. Patient had mammogram which revealed right breast mass located at 1 o'clock axis and left breast mass located at the 8 o'clock axis. Kelly Fisher underwent biopsies of left and right masses. Left breast negative for breast cancer. Right breast is invasive ductal ER+ /PR+/HER2 - by [facility]. She notes that she had yearly mammograms until about 10 years ago.   INTERVAL HISTORY: Patient presents to the clinic today for right breast cancer follow up. The patient reports she is doing well, though she is fatigued. Patient is current taking Arimidex without any side effects. She reports still feeling a lump in her breast that has not changed in size recently. She reports some chest pain. The patient reports she has difficulty sleeping at night, and sleeps occasionally during the  day. She reports additional falls in her home recently.   MEDICAL HISTORY:  Past Medical History:  Diagnosis Date  . CAD (coronary artery disease)   . High cholesterol   . Hypertension     SURGICAL HISTORY: Past Surgical History:  Procedure Laterality Date  . CORONARY ARTERY BYPASS GRAFT    . HIP FRACTURE SURGERY Left   . TOTAL KNEE ARTHROPLASTY Right     SOCIAL HISTORY: Social History   Social History  . Marital status: Married    Spouse name: N/A  . Number of children: N/A  . Years of education: N/A   Occupational History  . Not on file.   Social History Main Topics  . Smoking status: Never Smoker  . Smokeless tobacco: Never Used  . Alcohol use No  . Drug use: No  . Sexual activity: Not on file   Other Topics Concern  . Not on file   Social History Narrative  . No narrative on file  Married 72 years.  1 child. She is 19 years old. 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 Doesn't drink alcohol Raised in [place] [facility] [place] and gardening were her hobbies.  FAMILY HISTORY: No family history on file. Mother passed at 70 from a stroke Father passed at 108 from a stroke 2 sisters deceased and 2 brothers deceased. Sisters both passed from stroke. Unsure of brothers' cause of death.  ALLERGIES:  has no allergies on file.  MEDICATIONS:  Current Outpatient Prescriptions  Medication  Sig Dispense Refill  . allopurinol (ZYLOPRIM) 300 MG tablet Take 300 mg by mouth daily.    Marland Kitchen aspirin EC 81 MG tablet Take 81 mg by mouth daily.    . calcium-vitamin D (OSCAL WITH D) 500-200 MG-UNIT tablet Take 2 tablets by mouth daily with breakfast. 60 tablet 5  . clopidogrel (PLAVIX) 75 MG tablet Take 75 mg by mouth daily.    . ferrous sulfate 325 (65 FE) MG tablet Take 325 mg by mouth daily with breakfast.    . linagliptin (TRADJENTA) 5 MG TABS tablet Take 5 mg by mouth daily.    Marland Kitchen lisinopril (PRINIVIL,ZESTRIL) 5 MG tablet Take 5 mg by mouth  daily.    . metFORMIN (GLUCOPHAGE) 500 MG tablet Take 1,000 mg by mouth 2 (two) times daily.    . metoprolol tartrate (LOPRESSOR) 25 MG tablet Take 25 mg by mouth daily.    . pantoprazole (PROTONIX) 40 MG tablet Take 40 mg by mouth daily.    . simvastatin (ZOCOR) 40 MG tablet Take 40 mg by mouth.    . triamcinolone lotion (KENALOG) 0.1 % Apply 1 application topically 2 (two) times daily.     No current facility-administered medications for this visit.     Review of Systems  Constitutional: Negative for malaise/fatigue (Difficulty sleeping at night) and weight loss.       Denies hot flashes  HENT: Negative.   Eyes: Negative.   Respiratory: Negative.  Negative for shortness of breath.   Cardiovascular: Positive for chest pain (Occasional and mild).  Gastrointestinal: Negative.  Negative for abdominal pain.  Musculoskeletal: Positive for falls (Several falls in her home recently).  Skin: Negative.   Neurological: Negative.   Endo/Heme/Allergies: Negative.   Psychiatric/Behavioral: Negative.   All other systems reviewed and are negative. 14 point ROS was done and is otherwise as detailed above or in HPI   PHYSICAL EXAMINATION:  ECOG PERFORMANCE STATUS: 1  Vitals:   03/30/17 1442  BP: (!) 141/52  Pulse: 61  Resp: 16  Temp: 98.5 F (36.9 C)    Physical Exam  Constitutional: She is oriented to person, place, and time and well-developed, well-nourished, and in no distress. No distress.  HENT:  Head: Normocephalic and atraumatic.  Mouth/Throat: Oropharynx is clear and moist. No oropharyngeal exudate.  Eyes: Conjunctivae and EOM are normal. Pupils are equal, round, and reactive to light. Right eye exhibits no discharge. Left eye exhibits no discharge. No scleral icterus.  Neck: Normal range of motion. Neck supple. No tracheal deviation present. No thyromegaly present.  Cardiovascular: Normal rate, regular rhythm and normal heart sounds.   Pulmonary/Chest: Effort normal and  breath sounds normal. No respiratory distress. She has no wheezes.    Abdominal: Soft. Bowel sounds are normal. She exhibits no distension and no mass. There is no tenderness. There is no rebound and no guarding.  Musculoskeletal: Normal range of motion. She exhibits no edema.  Lymphadenopathy:    She has no cervical adenopathy.  Neurological: She is alert and oriented to person, place, and time. No cranial nerve deficit. Gait normal.  Skin: Skin is warm and dry. No rash noted. No erythema.  Psychiatric: Mood, memory, affect and judgment normal.  Nursing note and vitals reviewed.   LABORATORY DATA:  I have reviewed the data as listed Lab Results  Component Value Date   WBC 4.4 03/30/2017   HGB 11.1 (L) 03/30/2017   HCT 34.7 (L) 03/30/2017   MCV 92.0 03/30/2017   PLT 167 03/30/2017  CMP     Component Value Date/Time   NA 140 01/14/2017 0941   K 4.5 01/14/2017 0941   CL 104 01/14/2017 0941   CO2 30 01/14/2017 0941   GLUCOSE 132 (H) 01/14/2017 0941   BUN 15 01/14/2017 0941   CREATININE 0.67 01/14/2017 0941   CALCIUM 9.5 01/14/2017 0941   PROT 6.7 01/14/2017 0941   ALBUMIN 3.6 01/14/2017 0941   AST 19 01/14/2017 0941   ALT 11 (L) 01/14/2017 0941   ALKPHOS 50 01/14/2017 0941   BILITOT 0.5 01/14/2017 0941   GFRNONAA >60 01/14/2017 0941   GFRAA >60 01/14/2017 0941     RADIOGRAPHIC STUDIES: I have personally reviewed the radiological images as listed and agreed with the findings in the report. No results found.  Study Results  NUCLEAR MEDICINE PET SKULL BASE TO THIGH 09/27/2016  IMPRESSION: 1. Small hypermetabolic mass in the medial RIGHT breast presumed breast carcinoma. 2. No evidence of nodal metastasis within the RIGHT axilla or intrathoracic nodes. 3. Hypermetabolic RIGHT middle lobe pulmonary nodule increased mildly in size from 2014 is favored a low-grade neoplasm or inflammatory nodule. 4. Hypermetabolic activity in the substernal mediastinum  and ascending aorta is favored post procedural inflammation.   ASSESSMENT & PLAN:  Invasive ductal carcinoma of R breast ER+ PR+ HER 2 - Advanced Age RML pulmonary nodule DEXA 12/2016- osteoporosis  83 year old female with newly diagnosed Right ER+ PR+ HER 2 - infiltrating ductal carcinoma, tumor dimension no larger than 2 cm, easily palpable. Currently patient and her husband are reluctant to do surgery. Currently on arimidex.  PLAN: - Breast mass is clinically unchanged on exam today, its the same size. No enlargement of the breast mass. Continue arimidex at this time. Patient is tolerating it well. - Continue Prolia injections q 47month for osteoporosis. - RTC for follow up in 3 months.  This document serves as a record of services personally performed by LTwana First MD. It was created on her behalf by SMaryla Morrow a trained medical scribe. The creation of this record is based on the scribe's personal observations and the provider's statements to them. This document has been checked and approved by the attending provider.  I have reviewed the above documentation for accuracy and completeness and I agree with the above.  This note was electronically signed by:  LTwana First MD 03/30/2017 2:41 PM

## 2017-03-30 NOTE — Patient Instructions (Signed)
[facility] at [facility] Discharge Instructions  RECOMMENDATIONS MADE BY THE CONSULTANT AND ANY TEST RESULTS WILL BE SENT TO YOUR REFERRING PHYSICIAN.  You were seen today by Dr. Louise Zhou Follow up in 3 months  Thank you for choosing [facility] at [facility] to provide your oncology and hematology care.  To afford each patient quality time with our provider, please arrive at least 15 minutes before your scheduled appointment time.    If you have a lab appointment with the Cancer Center please come in thru the  Main Entrance and check in at the main information desk  You need to re-schedule your appointment should you arrive 10 or more minutes late.  We strive to give you quality time with our providers, and arriving late affects you and other patients whose appointments are after yours.  Also, if you no show three or more times for appointments you may be dismissed from the clinic at the providers discretion.     Again, thank you for choosing [facility].  Our hope is that these requests will decrease the amount of time that you wait before being seen by our physicians.       _____________________________________________________________  Should you have questions after your visit to [facility], please contact our office at [phone] between the hours of 8:30 a.m. and 4:30 p.m.  Voicemails left after 4:30 p.m. will not be returned until the following business day.  For prescription refill requests, have your pharmacy contact our office.       Resources For Cancer Patients and their Caregivers ? American Cancer Society: Can assist with transportation, wigs, general needs, runs Look Good Feel Better.        1-888-227-6333 ? Cancer Care: Provides financial assistance, online support groups, medication/co-pay assistance.  1-800-813-HOPE (4673) ? [facility] Assists [place] cancer  patients and their families through emotional , educational and financial support.  336-427-4357 ? [place] DSS Where to apply for food stamps, Medicaid and utility assistance. 336-342-1394 ? [facility]: Transportation to medical appointments. 336-347-2287 ? Social Security Administration: May apply for disability if have a Stage IV cancer. 336-342-7796 1-800-772-1213 ? [place] Aging, Disability and Transit Services: Assists with nutrition, care and transit needs. 336-349-2343  Cancer Center Support Programs: @10RELATIVEDAYS@ > Cancer Support Group  2nd Tuesday of the month 1pm-2pm, Journey Room  > Creative Journey  3rd Tuesday of the month 1130am-1pm, Journey Room  > Look Good Feel Better  1st Wednesday of the month 10am-12 noon, Journey Room (Call American Cancer Society to register 1-800-395-5775)    

## 2017-03-31 LAB — VITAMIN D 25 HYDROXY (VIT D DEFICIENCY, FRACTURES): VIT D 25 HYDROXY: 27.5 ng/mL — AB (ref 30.0–100.0)

## 2017-04-15 DIAGNOSIS — I10 Essential (primary) hypertension: Secondary | ICD-10-CM | POA: Diagnosis not present

## 2017-04-15 DIAGNOSIS — E119 Type 2 diabetes mellitus without complications: Secondary | ICD-10-CM | POA: Diagnosis not present

## 2017-05-02 DIAGNOSIS — E119 Type 2 diabetes mellitus without complications: Secondary | ICD-10-CM | POA: Diagnosis not present

## 2017-05-02 DIAGNOSIS — I10 Essential (primary) hypertension: Secondary | ICD-10-CM | POA: Diagnosis not present

## 2017-05-03 DIAGNOSIS — E1140 Type 2 diabetes mellitus with diabetic neuropathy, unspecified: Secondary | ICD-10-CM | POA: Diagnosis not present

## 2017-05-03 DIAGNOSIS — E1151 Type 2 diabetes mellitus with diabetic peripheral angiopathy without gangrene: Secondary | ICD-10-CM | POA: Diagnosis not present

## 2017-05-05 DIAGNOSIS — E785 Hyperlipidemia, unspecified: Secondary | ICD-10-CM | POA: Diagnosis not present

## 2017-05-05 DIAGNOSIS — K449 Diaphragmatic hernia without obstruction or gangrene: Secondary | ICD-10-CM | POA: Diagnosis not present

## 2017-05-05 DIAGNOSIS — Z780 Asymptomatic menopausal state: Secondary | ICD-10-CM | POA: Diagnosis not present

## 2017-05-05 DIAGNOSIS — Z823 Family history of stroke: Secondary | ICD-10-CM | POA: Diagnosis not present

## 2017-05-05 DIAGNOSIS — D62 Acute posthemorrhagic anemia: Secondary | ICD-10-CM | POA: Diagnosis not present

## 2017-05-05 DIAGNOSIS — Z952 Presence of prosthetic heart valve: Secondary | ICD-10-CM | POA: Diagnosis not present

## 2017-05-05 DIAGNOSIS — I5032 Chronic diastolic (congestive) heart failure: Secondary | ICD-10-CM | POA: Diagnosis not present

## 2017-05-05 DIAGNOSIS — Z96651 Presence of right artificial knee joint: Secondary | ICD-10-CM | POA: Diagnosis present

## 2017-05-05 DIAGNOSIS — K922 Gastrointestinal hemorrhage, unspecified: Secondary | ICD-10-CM | POA: Diagnosis not present

## 2017-05-05 DIAGNOSIS — I2510 Atherosclerotic heart disease of native coronary artery without angina pectoris: Secondary | ICD-10-CM | POA: Diagnosis not present

## 2017-05-05 DIAGNOSIS — Z7982 Long term (current) use of aspirin: Secondary | ICD-10-CM | POA: Diagnosis not present

## 2017-05-05 DIAGNOSIS — Z96642 Presence of left artificial hip joint: Secondary | ICD-10-CM | POA: Diagnosis present

## 2017-05-05 DIAGNOSIS — I110 Hypertensive heart disease with heart failure: Secondary | ICD-10-CM | POA: Diagnosis not present

## 2017-05-05 DIAGNOSIS — Z7984 Long term (current) use of oral hypoglycemic drugs: Secondary | ICD-10-CM | POA: Diagnosis not present

## 2017-05-05 DIAGNOSIS — J811 Chronic pulmonary edema: Secondary | ICD-10-CM | POA: Diagnosis not present

## 2017-05-05 DIAGNOSIS — E119 Type 2 diabetes mellitus without complications: Secondary | ICD-10-CM | POA: Diagnosis not present

## 2017-05-05 DIAGNOSIS — Z79899 Other long term (current) drug therapy: Secondary | ICD-10-CM | POA: Diagnosis not present

## 2017-05-05 DIAGNOSIS — Z853 Personal history of malignant neoplasm of breast: Secondary | ICD-10-CM | POA: Diagnosis not present

## 2017-05-05 DIAGNOSIS — Z8249 Family history of ischemic heart disease and other diseases of the circulatory system: Secondary | ICD-10-CM | POA: Diagnosis not present

## 2017-05-05 DIAGNOSIS — I10 Essential (primary) hypertension: Secondary | ICD-10-CM | POA: Diagnosis not present

## 2017-05-05 DIAGNOSIS — K5731 Diverticulosis of large intestine without perforation or abscess with bleeding: Secondary | ICD-10-CM | POA: Diagnosis not present

## 2017-05-05 DIAGNOSIS — Z951 Presence of aortocoronary bypass graft: Secondary | ICD-10-CM | POA: Diagnosis not present

## 2017-05-10 DIAGNOSIS — Z7984 Long term (current) use of oral hypoglycemic drugs: Secondary | ICD-10-CM | POA: Diagnosis not present

## 2017-05-10 DIAGNOSIS — K5733 Diverticulitis of large intestine without perforation or abscess with bleeding: Secondary | ICD-10-CM | POA: Diagnosis not present

## 2017-05-10 DIAGNOSIS — I110 Hypertensive heart disease with heart failure: Secondary | ICD-10-CM | POA: Diagnosis not present

## 2017-05-10 DIAGNOSIS — D62 Acute posthemorrhagic anemia: Secondary | ICD-10-CM | POA: Diagnosis not present

## 2017-05-10 DIAGNOSIS — E119 Type 2 diabetes mellitus without complications: Secondary | ICD-10-CM | POA: Diagnosis not present

## 2017-05-10 DIAGNOSIS — Z9181 History of falling: Secondary | ICD-10-CM | POA: Diagnosis not present

## 2017-05-10 DIAGNOSIS — I5030 Unspecified diastolic (congestive) heart failure: Secondary | ICD-10-CM | POA: Diagnosis not present

## 2017-05-10 DIAGNOSIS — I2510 Atherosclerotic heart disease of native coronary artery without angina pectoris: Secondary | ICD-10-CM | POA: Diagnosis not present

## 2017-05-10 DIAGNOSIS — Z7982 Long term (current) use of aspirin: Secondary | ICD-10-CM | POA: Diagnosis not present

## 2017-05-10 DIAGNOSIS — Z96651 Presence of right artificial knee joint: Secondary | ICD-10-CM | POA: Diagnosis not present

## 2017-05-12 DIAGNOSIS — I2510 Atherosclerotic heart disease of native coronary artery without angina pectoris: Secondary | ICD-10-CM | POA: Diagnosis not present

## 2017-05-12 DIAGNOSIS — D62 Acute posthemorrhagic anemia: Secondary | ICD-10-CM | POA: Diagnosis not present

## 2017-05-12 DIAGNOSIS — E119 Type 2 diabetes mellitus without complications: Secondary | ICD-10-CM | POA: Diagnosis not present

## 2017-05-12 DIAGNOSIS — I110 Hypertensive heart disease with heart failure: Secondary | ICD-10-CM | POA: Diagnosis not present

## 2017-05-12 DIAGNOSIS — K5733 Diverticulitis of large intestine without perforation or abscess with bleeding: Secondary | ICD-10-CM | POA: Diagnosis not present

## 2017-05-12 DIAGNOSIS — I5030 Unspecified diastolic (congestive) heart failure: Secondary | ICD-10-CM | POA: Diagnosis not present

## 2017-05-13 DIAGNOSIS — E119 Type 2 diabetes mellitus without complications: Secondary | ICD-10-CM | POA: Diagnosis not present

## 2017-05-13 DIAGNOSIS — I5030 Unspecified diastolic (congestive) heart failure: Secondary | ICD-10-CM | POA: Diagnosis not present

## 2017-05-13 DIAGNOSIS — D62 Acute posthemorrhagic anemia: Secondary | ICD-10-CM | POA: Diagnosis not present

## 2017-05-13 DIAGNOSIS — I2510 Atherosclerotic heart disease of native coronary artery without angina pectoris: Secondary | ICD-10-CM | POA: Diagnosis not present

## 2017-05-13 DIAGNOSIS — K5733 Diverticulitis of large intestine without perforation or abscess with bleeding: Secondary | ICD-10-CM | POA: Diagnosis not present

## 2017-05-13 DIAGNOSIS — I110 Hypertensive heart disease with heart failure: Secondary | ICD-10-CM | POA: Diagnosis not present

## 2017-05-17 DIAGNOSIS — K5733 Diverticulitis of large intestine without perforation or abscess with bleeding: Secondary | ICD-10-CM | POA: Diagnosis not present

## 2017-05-17 DIAGNOSIS — K51511 Left sided colitis with rectal bleeding: Secondary | ICD-10-CM | POA: Diagnosis not present

## 2017-05-17 DIAGNOSIS — I5030 Unspecified diastolic (congestive) heart failure: Secondary | ICD-10-CM | POA: Diagnosis not present

## 2017-05-17 DIAGNOSIS — D500 Iron deficiency anemia secondary to blood loss (chronic): Secondary | ICD-10-CM | POA: Diagnosis not present

## 2017-05-17 DIAGNOSIS — E119 Type 2 diabetes mellitus without complications: Secondary | ICD-10-CM | POA: Diagnosis not present

## 2017-05-17 DIAGNOSIS — Z1389 Encounter for screening for other disorder: Secondary | ICD-10-CM | POA: Diagnosis not present

## 2017-05-17 DIAGNOSIS — Z0000 Encounter for general adult medical examination without abnormal findings: Secondary | ICD-10-CM | POA: Diagnosis not present

## 2017-05-17 DIAGNOSIS — D62 Acute posthemorrhagic anemia: Secondary | ICD-10-CM | POA: Diagnosis not present

## 2017-05-17 DIAGNOSIS — I110 Hypertensive heart disease with heart failure: Secondary | ICD-10-CM | POA: Diagnosis not present

## 2017-05-17 DIAGNOSIS — I2510 Atherosclerotic heart disease of native coronary artery without angina pectoris: Secondary | ICD-10-CM | POA: Diagnosis not present

## 2017-05-18 DIAGNOSIS — D62 Acute posthemorrhagic anemia: Secondary | ICD-10-CM | POA: Diagnosis not present

## 2017-05-18 DIAGNOSIS — I2510 Atherosclerotic heart disease of native coronary artery without angina pectoris: Secondary | ICD-10-CM | POA: Diagnosis not present

## 2017-05-18 DIAGNOSIS — I5030 Unspecified diastolic (congestive) heart failure: Secondary | ICD-10-CM | POA: Diagnosis not present

## 2017-05-18 DIAGNOSIS — E119 Type 2 diabetes mellitus without complications: Secondary | ICD-10-CM | POA: Diagnosis not present

## 2017-05-18 DIAGNOSIS — K5733 Diverticulitis of large intestine without perforation or abscess with bleeding: Secondary | ICD-10-CM | POA: Diagnosis not present

## 2017-05-18 DIAGNOSIS — I110 Hypertensive heart disease with heart failure: Secondary | ICD-10-CM | POA: Diagnosis not present

## 2017-05-19 DIAGNOSIS — I2510 Atherosclerotic heart disease of native coronary artery without angina pectoris: Secondary | ICD-10-CM | POA: Diagnosis not present

## 2017-05-19 DIAGNOSIS — D62 Acute posthemorrhagic anemia: Secondary | ICD-10-CM | POA: Diagnosis not present

## 2017-05-19 DIAGNOSIS — I110 Hypertensive heart disease with heart failure: Secondary | ICD-10-CM | POA: Diagnosis not present

## 2017-05-19 DIAGNOSIS — E119 Type 2 diabetes mellitus without complications: Secondary | ICD-10-CM | POA: Diagnosis not present

## 2017-05-19 DIAGNOSIS — K5733 Diverticulitis of large intestine without perforation or abscess with bleeding: Secondary | ICD-10-CM | POA: Diagnosis not present

## 2017-05-19 DIAGNOSIS — I5030 Unspecified diastolic (congestive) heart failure: Secondary | ICD-10-CM | POA: Diagnosis not present

## 2017-05-20 DIAGNOSIS — D62 Acute posthemorrhagic anemia: Secondary | ICD-10-CM | POA: Diagnosis not present

## 2017-05-20 DIAGNOSIS — I5030 Unspecified diastolic (congestive) heart failure: Secondary | ICD-10-CM | POA: Diagnosis not present

## 2017-05-20 DIAGNOSIS — I110 Hypertensive heart disease with heart failure: Secondary | ICD-10-CM | POA: Diagnosis not present

## 2017-05-20 DIAGNOSIS — K5733 Diverticulitis of large intestine without perforation or abscess with bleeding: Secondary | ICD-10-CM | POA: Diagnosis not present

## 2017-05-20 DIAGNOSIS — E119 Type 2 diabetes mellitus without complications: Secondary | ICD-10-CM | POA: Diagnosis not present

## 2017-05-20 DIAGNOSIS — I2510 Atherosclerotic heart disease of native coronary artery without angina pectoris: Secondary | ICD-10-CM | POA: Diagnosis not present

## 2017-05-23 DIAGNOSIS — E119 Type 2 diabetes mellitus without complications: Secondary | ICD-10-CM | POA: Diagnosis not present

## 2017-05-23 DIAGNOSIS — K5733 Diverticulitis of large intestine without perforation or abscess with bleeding: Secondary | ICD-10-CM | POA: Diagnosis not present

## 2017-05-23 DIAGNOSIS — I5030 Unspecified diastolic (congestive) heart failure: Secondary | ICD-10-CM | POA: Diagnosis not present

## 2017-05-23 DIAGNOSIS — I2510 Atherosclerotic heart disease of native coronary artery without angina pectoris: Secondary | ICD-10-CM | POA: Diagnosis not present

## 2017-05-23 DIAGNOSIS — D62 Acute posthemorrhagic anemia: Secondary | ICD-10-CM | POA: Diagnosis not present

## 2017-05-23 DIAGNOSIS — I110 Hypertensive heart disease with heart failure: Secondary | ICD-10-CM | POA: Diagnosis not present

## 2017-05-24 DIAGNOSIS — I5030 Unspecified diastolic (congestive) heart failure: Secondary | ICD-10-CM | POA: Diagnosis not present

## 2017-05-24 DIAGNOSIS — E119 Type 2 diabetes mellitus without complications: Secondary | ICD-10-CM | POA: Diagnosis not present

## 2017-05-24 DIAGNOSIS — I110 Hypertensive heart disease with heart failure: Secondary | ICD-10-CM | POA: Diagnosis not present

## 2017-05-24 DIAGNOSIS — K5733 Diverticulitis of large intestine without perforation or abscess with bleeding: Secondary | ICD-10-CM | POA: Diagnosis not present

## 2017-05-24 DIAGNOSIS — D62 Acute posthemorrhagic anemia: Secondary | ICD-10-CM | POA: Diagnosis not present

## 2017-05-24 DIAGNOSIS — I2510 Atherosclerotic heart disease of native coronary artery without angina pectoris: Secondary | ICD-10-CM | POA: Diagnosis not present

## 2017-05-26 DIAGNOSIS — E119 Type 2 diabetes mellitus without complications: Secondary | ICD-10-CM | POA: Diagnosis not present

## 2017-05-26 DIAGNOSIS — I2510 Atherosclerotic heart disease of native coronary artery without angina pectoris: Secondary | ICD-10-CM | POA: Diagnosis not present

## 2017-05-26 DIAGNOSIS — D62 Acute posthemorrhagic anemia: Secondary | ICD-10-CM | POA: Diagnosis not present

## 2017-05-26 DIAGNOSIS — I5030 Unspecified diastolic (congestive) heart failure: Secondary | ICD-10-CM | POA: Diagnosis not present

## 2017-05-26 DIAGNOSIS — I110 Hypertensive heart disease with heart failure: Secondary | ICD-10-CM | POA: Diagnosis not present

## 2017-05-26 DIAGNOSIS — K5733 Diverticulitis of large intestine without perforation or abscess with bleeding: Secondary | ICD-10-CM | POA: Diagnosis not present

## 2017-05-30 DIAGNOSIS — E119 Type 2 diabetes mellitus without complications: Secondary | ICD-10-CM | POA: Diagnosis not present

## 2017-05-30 DIAGNOSIS — K5733 Diverticulitis of large intestine without perforation or abscess with bleeding: Secondary | ICD-10-CM | POA: Diagnosis not present

## 2017-05-30 DIAGNOSIS — D62 Acute posthemorrhagic anemia: Secondary | ICD-10-CM | POA: Diagnosis not present

## 2017-05-30 DIAGNOSIS — I5030 Unspecified diastolic (congestive) heart failure: Secondary | ICD-10-CM | POA: Diagnosis not present

## 2017-05-30 DIAGNOSIS — I110 Hypertensive heart disease with heart failure: Secondary | ICD-10-CM | POA: Diagnosis not present

## 2017-05-30 DIAGNOSIS — I2510 Atherosclerotic heart disease of native coronary artery without angina pectoris: Secondary | ICD-10-CM | POA: Diagnosis not present

## 2017-06-01 DIAGNOSIS — D62 Acute posthemorrhagic anemia: Secondary | ICD-10-CM | POA: Diagnosis not present

## 2017-06-01 DIAGNOSIS — I110 Hypertensive heart disease with heart failure: Secondary | ICD-10-CM | POA: Diagnosis not present

## 2017-06-01 DIAGNOSIS — I5030 Unspecified diastolic (congestive) heart failure: Secondary | ICD-10-CM | POA: Diagnosis not present

## 2017-06-01 DIAGNOSIS — E119 Type 2 diabetes mellitus without complications: Secondary | ICD-10-CM | POA: Diagnosis not present

## 2017-06-01 DIAGNOSIS — I2510 Atherosclerotic heart disease of native coronary artery without angina pectoris: Secondary | ICD-10-CM | POA: Diagnosis not present

## 2017-06-01 DIAGNOSIS — K5733 Diverticulitis of large intestine without perforation or abscess with bleeding: Secondary | ICD-10-CM | POA: Diagnosis not present

## 2017-06-02 DIAGNOSIS — E119 Type 2 diabetes mellitus without complications: Secondary | ICD-10-CM | POA: Diagnosis not present

## 2017-06-02 DIAGNOSIS — I10 Essential (primary) hypertension: Secondary | ICD-10-CM | POA: Diagnosis not present

## 2017-06-03 DIAGNOSIS — I110 Hypertensive heart disease with heart failure: Secondary | ICD-10-CM | POA: Diagnosis not present

## 2017-06-03 DIAGNOSIS — I2510 Atherosclerotic heart disease of native coronary artery without angina pectoris: Secondary | ICD-10-CM | POA: Diagnosis not present

## 2017-06-03 DIAGNOSIS — K5733 Diverticulitis of large intestine without perforation or abscess with bleeding: Secondary | ICD-10-CM | POA: Diagnosis not present

## 2017-06-03 DIAGNOSIS — E119 Type 2 diabetes mellitus without complications: Secondary | ICD-10-CM | POA: Diagnosis not present

## 2017-06-03 DIAGNOSIS — I5030 Unspecified diastolic (congestive) heart failure: Secondary | ICD-10-CM | POA: Diagnosis not present

## 2017-06-03 DIAGNOSIS — D62 Acute posthemorrhagic anemia: Secondary | ICD-10-CM | POA: Diagnosis not present

## 2017-06-06 DIAGNOSIS — I5030 Unspecified diastolic (congestive) heart failure: Secondary | ICD-10-CM | POA: Diagnosis not present

## 2017-06-06 DIAGNOSIS — D62 Acute posthemorrhagic anemia: Secondary | ICD-10-CM | POA: Diagnosis not present

## 2017-06-06 DIAGNOSIS — E119 Type 2 diabetes mellitus without complications: Secondary | ICD-10-CM | POA: Diagnosis not present

## 2017-06-06 DIAGNOSIS — I110 Hypertensive heart disease with heart failure: Secondary | ICD-10-CM | POA: Diagnosis not present

## 2017-06-06 DIAGNOSIS — K5733 Diverticulitis of large intestine without perforation or abscess with bleeding: Secondary | ICD-10-CM | POA: Diagnosis not present

## 2017-06-06 DIAGNOSIS — I2510 Atherosclerotic heart disease of native coronary artery without angina pectoris: Secondary | ICD-10-CM | POA: Diagnosis not present

## 2017-06-07 DIAGNOSIS — I2510 Atherosclerotic heart disease of native coronary artery without angina pectoris: Secondary | ICD-10-CM | POA: Diagnosis not present

## 2017-06-07 DIAGNOSIS — D62 Acute posthemorrhagic anemia: Secondary | ICD-10-CM | POA: Diagnosis not present

## 2017-06-07 DIAGNOSIS — I110 Hypertensive heart disease with heart failure: Secondary | ICD-10-CM | POA: Diagnosis not present

## 2017-06-07 DIAGNOSIS — I5030 Unspecified diastolic (congestive) heart failure: Secondary | ICD-10-CM | POA: Diagnosis not present

## 2017-06-07 DIAGNOSIS — K5733 Diverticulitis of large intestine without perforation or abscess with bleeding: Secondary | ICD-10-CM | POA: Diagnosis not present

## 2017-06-07 DIAGNOSIS — E119 Type 2 diabetes mellitus without complications: Secondary | ICD-10-CM | POA: Diagnosis not present

## 2017-06-08 DIAGNOSIS — I5030 Unspecified diastolic (congestive) heart failure: Secondary | ICD-10-CM | POA: Diagnosis not present

## 2017-06-08 DIAGNOSIS — I2510 Atherosclerotic heart disease of native coronary artery without angina pectoris: Secondary | ICD-10-CM | POA: Diagnosis not present

## 2017-06-08 DIAGNOSIS — I110 Hypertensive heart disease with heart failure: Secondary | ICD-10-CM | POA: Diagnosis not present

## 2017-06-08 DIAGNOSIS — K5733 Diverticulitis of large intestine without perforation or abscess with bleeding: Secondary | ICD-10-CM | POA: Diagnosis not present

## 2017-06-08 DIAGNOSIS — E119 Type 2 diabetes mellitus without complications: Secondary | ICD-10-CM | POA: Diagnosis not present

## 2017-06-08 DIAGNOSIS — D62 Acute posthemorrhagic anemia: Secondary | ICD-10-CM | POA: Diagnosis not present

## 2017-06-09 DIAGNOSIS — I2510 Atherosclerotic heart disease of native coronary artery without angina pectoris: Secondary | ICD-10-CM | POA: Diagnosis not present

## 2017-06-09 DIAGNOSIS — I5030 Unspecified diastolic (congestive) heart failure: Secondary | ICD-10-CM | POA: Diagnosis not present

## 2017-06-09 DIAGNOSIS — K5733 Diverticulitis of large intestine without perforation or abscess with bleeding: Secondary | ICD-10-CM | POA: Diagnosis not present

## 2017-06-09 DIAGNOSIS — I110 Hypertensive heart disease with heart failure: Secondary | ICD-10-CM | POA: Diagnosis not present

## 2017-06-09 DIAGNOSIS — E119 Type 2 diabetes mellitus without complications: Secondary | ICD-10-CM | POA: Diagnosis not present

## 2017-06-09 DIAGNOSIS — D62 Acute posthemorrhagic anemia: Secondary | ICD-10-CM | POA: Diagnosis not present

## 2017-06-13 DIAGNOSIS — I110 Hypertensive heart disease with heart failure: Secondary | ICD-10-CM | POA: Diagnosis not present

## 2017-06-13 DIAGNOSIS — D62 Acute posthemorrhagic anemia: Secondary | ICD-10-CM | POA: Diagnosis not present

## 2017-06-13 DIAGNOSIS — I5030 Unspecified diastolic (congestive) heart failure: Secondary | ICD-10-CM | POA: Diagnosis not present

## 2017-06-13 DIAGNOSIS — I2510 Atherosclerotic heart disease of native coronary artery without angina pectoris: Secondary | ICD-10-CM | POA: Diagnosis not present

## 2017-06-13 DIAGNOSIS — K5733 Diverticulitis of large intestine without perforation or abscess with bleeding: Secondary | ICD-10-CM | POA: Diagnosis not present

## 2017-06-13 DIAGNOSIS — E119 Type 2 diabetes mellitus without complications: Secondary | ICD-10-CM | POA: Diagnosis not present

## 2017-06-14 DIAGNOSIS — E119 Type 2 diabetes mellitus without complications: Secondary | ICD-10-CM | POA: Diagnosis not present

## 2017-06-14 DIAGNOSIS — I110 Hypertensive heart disease with heart failure: Secondary | ICD-10-CM | POA: Diagnosis not present

## 2017-06-14 DIAGNOSIS — I2510 Atherosclerotic heart disease of native coronary artery without angina pectoris: Secondary | ICD-10-CM | POA: Diagnosis not present

## 2017-06-14 DIAGNOSIS — D62 Acute posthemorrhagic anemia: Secondary | ICD-10-CM | POA: Diagnosis not present

## 2017-06-14 DIAGNOSIS — I5030 Unspecified diastolic (congestive) heart failure: Secondary | ICD-10-CM | POA: Diagnosis not present

## 2017-06-14 DIAGNOSIS — K5733 Diverticulitis of large intestine without perforation or abscess with bleeding: Secondary | ICD-10-CM | POA: Diagnosis not present

## 2017-06-16 DIAGNOSIS — E119 Type 2 diabetes mellitus without complications: Secondary | ICD-10-CM | POA: Diagnosis not present

## 2017-06-16 DIAGNOSIS — I2510 Atherosclerotic heart disease of native coronary artery without angina pectoris: Secondary | ICD-10-CM | POA: Diagnosis not present

## 2017-06-16 DIAGNOSIS — K5733 Diverticulitis of large intestine without perforation or abscess with bleeding: Secondary | ICD-10-CM | POA: Diagnosis not present

## 2017-06-16 DIAGNOSIS — I110 Hypertensive heart disease with heart failure: Secondary | ICD-10-CM | POA: Diagnosis not present

## 2017-06-16 DIAGNOSIS — D62 Acute posthemorrhagic anemia: Secondary | ICD-10-CM | POA: Diagnosis not present

## 2017-06-16 DIAGNOSIS — I5030 Unspecified diastolic (congestive) heart failure: Secondary | ICD-10-CM | POA: Diagnosis not present

## 2017-06-17 DIAGNOSIS — E119 Type 2 diabetes mellitus without complications: Secondary | ICD-10-CM | POA: Diagnosis not present

## 2017-06-17 DIAGNOSIS — K5733 Diverticulitis of large intestine without perforation or abscess with bleeding: Secondary | ICD-10-CM | POA: Diagnosis not present

## 2017-06-17 DIAGNOSIS — I110 Hypertensive heart disease with heart failure: Secondary | ICD-10-CM | POA: Diagnosis not present

## 2017-06-17 DIAGNOSIS — D62 Acute posthemorrhagic anemia: Secondary | ICD-10-CM | POA: Diagnosis not present

## 2017-06-17 DIAGNOSIS — I5030 Unspecified diastolic (congestive) heart failure: Secondary | ICD-10-CM | POA: Diagnosis not present

## 2017-06-17 DIAGNOSIS — I2510 Atherosclerotic heart disease of native coronary artery without angina pectoris: Secondary | ICD-10-CM | POA: Diagnosis not present

## 2017-06-20 DIAGNOSIS — K5733 Diverticulitis of large intestine without perforation or abscess with bleeding: Secondary | ICD-10-CM | POA: Diagnosis not present

## 2017-06-20 DIAGNOSIS — D62 Acute posthemorrhagic anemia: Secondary | ICD-10-CM | POA: Diagnosis not present

## 2017-06-20 DIAGNOSIS — I110 Hypertensive heart disease with heart failure: Secondary | ICD-10-CM | POA: Diagnosis not present

## 2017-06-20 DIAGNOSIS — E119 Type 2 diabetes mellitus without complications: Secondary | ICD-10-CM | POA: Diagnosis not present

## 2017-06-20 DIAGNOSIS — I2510 Atherosclerotic heart disease of native coronary artery without angina pectoris: Secondary | ICD-10-CM | POA: Diagnosis not present

## 2017-06-20 DIAGNOSIS — I5030 Unspecified diastolic (congestive) heart failure: Secondary | ICD-10-CM | POA: Diagnosis not present

## 2017-06-22 DIAGNOSIS — I110 Hypertensive heart disease with heart failure: Secondary | ICD-10-CM | POA: Diagnosis not present

## 2017-06-22 DIAGNOSIS — K5733 Diverticulitis of large intestine without perforation or abscess with bleeding: Secondary | ICD-10-CM | POA: Diagnosis not present

## 2017-06-22 DIAGNOSIS — E119 Type 2 diabetes mellitus without complications: Secondary | ICD-10-CM | POA: Diagnosis not present

## 2017-06-22 DIAGNOSIS — D62 Acute posthemorrhagic anemia: Secondary | ICD-10-CM | POA: Diagnosis not present

## 2017-06-22 DIAGNOSIS — I5030 Unspecified diastolic (congestive) heart failure: Secondary | ICD-10-CM | POA: Diagnosis not present

## 2017-06-22 DIAGNOSIS — I2510 Atherosclerotic heart disease of native coronary artery without angina pectoris: Secondary | ICD-10-CM | POA: Diagnosis not present

## 2017-06-24 DIAGNOSIS — D62 Acute posthemorrhagic anemia: Secondary | ICD-10-CM | POA: Diagnosis not present

## 2017-06-24 DIAGNOSIS — I5030 Unspecified diastolic (congestive) heart failure: Secondary | ICD-10-CM | POA: Diagnosis not present

## 2017-06-24 DIAGNOSIS — E119 Type 2 diabetes mellitus without complications: Secondary | ICD-10-CM | POA: Diagnosis not present

## 2017-06-24 DIAGNOSIS — I110 Hypertensive heart disease with heart failure: Secondary | ICD-10-CM | POA: Diagnosis not present

## 2017-06-24 DIAGNOSIS — K5733 Diverticulitis of large intestine without perforation or abscess with bleeding: Secondary | ICD-10-CM | POA: Diagnosis not present

## 2017-06-24 DIAGNOSIS — I2510 Atherosclerotic heart disease of native coronary artery without angina pectoris: Secondary | ICD-10-CM | POA: Diagnosis not present

## 2017-06-28 DIAGNOSIS — I110 Hypertensive heart disease with heart failure: Secondary | ICD-10-CM | POA: Diagnosis not present

## 2017-06-28 DIAGNOSIS — I2510 Atherosclerotic heart disease of native coronary artery without angina pectoris: Secondary | ICD-10-CM | POA: Diagnosis not present

## 2017-06-28 DIAGNOSIS — K5733 Diverticulitis of large intestine without perforation or abscess with bleeding: Secondary | ICD-10-CM | POA: Diagnosis not present

## 2017-06-28 DIAGNOSIS — D62 Acute posthemorrhagic anemia: Secondary | ICD-10-CM | POA: Diagnosis not present

## 2017-06-28 DIAGNOSIS — E119 Type 2 diabetes mellitus without complications: Secondary | ICD-10-CM | POA: Diagnosis not present

## 2017-06-28 DIAGNOSIS — I5030 Unspecified diastolic (congestive) heart failure: Secondary | ICD-10-CM | POA: Diagnosis not present

## 2017-06-29 DIAGNOSIS — I110 Hypertensive heart disease with heart failure: Secondary | ICD-10-CM | POA: Diagnosis not present

## 2017-06-29 DIAGNOSIS — K5733 Diverticulitis of large intestine without perforation or abscess with bleeding: Secondary | ICD-10-CM | POA: Diagnosis not present

## 2017-06-29 DIAGNOSIS — I2510 Atherosclerotic heart disease of native coronary artery without angina pectoris: Secondary | ICD-10-CM | POA: Diagnosis not present

## 2017-06-29 DIAGNOSIS — E119 Type 2 diabetes mellitus without complications: Secondary | ICD-10-CM | POA: Diagnosis not present

## 2017-06-29 DIAGNOSIS — D62 Acute posthemorrhagic anemia: Secondary | ICD-10-CM | POA: Diagnosis not present

## 2017-06-29 DIAGNOSIS — I5030 Unspecified diastolic (congestive) heart failure: Secondary | ICD-10-CM | POA: Diagnosis not present

## 2017-06-30 DIAGNOSIS — E119 Type 2 diabetes mellitus without complications: Secondary | ICD-10-CM | POA: Diagnosis not present

## 2017-06-30 DIAGNOSIS — D62 Acute posthemorrhagic anemia: Secondary | ICD-10-CM | POA: Diagnosis not present

## 2017-06-30 DIAGNOSIS — I2510 Atherosclerotic heart disease of native coronary artery without angina pectoris: Secondary | ICD-10-CM | POA: Diagnosis not present

## 2017-06-30 DIAGNOSIS — I110 Hypertensive heart disease with heart failure: Secondary | ICD-10-CM | POA: Diagnosis not present

## 2017-06-30 DIAGNOSIS — K5733 Diverticulitis of large intestine without perforation or abscess with bleeding: Secondary | ICD-10-CM | POA: Diagnosis not present

## 2017-06-30 DIAGNOSIS — I5030 Unspecified diastolic (congestive) heart failure: Secondary | ICD-10-CM | POA: Diagnosis not present

## 2017-07-05 DIAGNOSIS — K5733 Diverticulitis of large intestine without perforation or abscess with bleeding: Secondary | ICD-10-CM | POA: Diagnosis not present

## 2017-07-05 DIAGNOSIS — I10 Essential (primary) hypertension: Secondary | ICD-10-CM | POA: Diagnosis not present

## 2017-07-05 DIAGNOSIS — I5030 Unspecified diastolic (congestive) heart failure: Secondary | ICD-10-CM | POA: Diagnosis not present

## 2017-07-05 DIAGNOSIS — D62 Acute posthemorrhagic anemia: Secondary | ICD-10-CM | POA: Diagnosis not present

## 2017-07-05 DIAGNOSIS — E119 Type 2 diabetes mellitus without complications: Secondary | ICD-10-CM | POA: Diagnosis not present

## 2017-07-05 DIAGNOSIS — I2510 Atherosclerotic heart disease of native coronary artery without angina pectoris: Secondary | ICD-10-CM | POA: Diagnosis not present

## 2017-07-05 DIAGNOSIS — I110 Hypertensive heart disease with heart failure: Secondary | ICD-10-CM | POA: Diagnosis not present

## 2017-07-06 DIAGNOSIS — I5030 Unspecified diastolic (congestive) heart failure: Secondary | ICD-10-CM | POA: Diagnosis not present

## 2017-07-06 DIAGNOSIS — E119 Type 2 diabetes mellitus without complications: Secondary | ICD-10-CM | POA: Diagnosis not present

## 2017-07-06 DIAGNOSIS — D62 Acute posthemorrhagic anemia: Secondary | ICD-10-CM | POA: Diagnosis not present

## 2017-07-06 DIAGNOSIS — I2510 Atherosclerotic heart disease of native coronary artery without angina pectoris: Secondary | ICD-10-CM | POA: Diagnosis not present

## 2017-07-06 DIAGNOSIS — K5733 Diverticulitis of large intestine without perforation or abscess with bleeding: Secondary | ICD-10-CM | POA: Diagnosis not present

## 2017-07-06 DIAGNOSIS — I110 Hypertensive heart disease with heart failure: Secondary | ICD-10-CM | POA: Diagnosis not present

## 2017-07-15 ENCOUNTER — Encounter (HOSPITAL_COMMUNITY): Payer: Medicare Other

## 2017-07-15 ENCOUNTER — Other Ambulatory Visit (HOSPITAL_COMMUNITY): Payer: Medicare Other

## 2017-07-15 ENCOUNTER — Encounter (HOSPITAL_COMMUNITY): Payer: Self-pay | Admitting: Oncology

## 2017-07-15 ENCOUNTER — Other Ambulatory Visit (HOSPITAL_COMMUNITY): Payer: Self-pay | Admitting: Oncology

## 2017-07-15 ENCOUNTER — Encounter (HOSPITAL_COMMUNITY): Payer: Medicare Other | Attending: Oncology

## 2017-07-15 ENCOUNTER — Encounter (HOSPITAL_COMMUNITY): Payer: Medicare Other | Attending: Oncology | Admitting: Oncology

## 2017-07-15 VITALS — BP 113/92 | HR 60 | Temp 98.2°F | Resp 18 | Wt 115.6 lb

## 2017-07-15 DIAGNOSIS — Z7982 Long term (current) use of aspirin: Secondary | ICD-10-CM | POA: Insufficient documentation

## 2017-07-15 DIAGNOSIS — Z170 Estrogen receptor positive status [ER+]: Secondary | ICD-10-CM | POA: Insufficient documentation

## 2017-07-15 DIAGNOSIS — Z79811 Long term (current) use of aromatase inhibitors: Secondary | ICD-10-CM | POA: Diagnosis not present

## 2017-07-15 DIAGNOSIS — C50211 Malignant neoplasm of upper-inner quadrant of right female breast: Secondary | ICD-10-CM

## 2017-07-15 DIAGNOSIS — M810 Age-related osteoporosis without current pathological fracture: Secondary | ICD-10-CM

## 2017-07-15 DIAGNOSIS — R911 Solitary pulmonary nodule: Secondary | ICD-10-CM | POA: Insufficient documentation

## 2017-07-15 DIAGNOSIS — Z79899 Other long term (current) drug therapy: Secondary | ICD-10-CM | POA: Insufficient documentation

## 2017-07-15 DIAGNOSIS — Z823 Family history of stroke: Secondary | ICD-10-CM | POA: Insufficient documentation

## 2017-07-15 DIAGNOSIS — Z7984 Long term (current) use of oral hypoglycemic drugs: Secondary | ICD-10-CM | POA: Insufficient documentation

## 2017-07-15 DIAGNOSIS — Z9889 Other specified postprocedural states: Secondary | ICD-10-CM | POA: Insufficient documentation

## 2017-07-15 DIAGNOSIS — I10 Essential (primary) hypertension: Secondary | ICD-10-CM | POA: Diagnosis not present

## 2017-07-15 LAB — COMPREHENSIVE METABOLIC PANEL
ALBUMIN: 3.7 g/dL (ref 3.5–5.0)
ALT: 15 U/L (ref 14–54)
AST: 25 U/L (ref 15–41)
Alkaline Phosphatase: 52 U/L (ref 38–126)
Anion gap: 6 (ref 5–15)
BUN: 16 mg/dL (ref 6–20)
CHLORIDE: 104 mmol/L (ref 101–111)
CO2: 28 mmol/L (ref 22–32)
CREATININE: 0.73 mg/dL (ref 0.44–1.00)
Calcium: 9.3 mg/dL (ref 8.9–10.3)
GFR calc Af Amer: >60 mL/min (ref >60)
GFR calc non Af Amer: >60 mL/min (ref >60)
GLUCOSE: 110 mg/dL — AB (ref 65–99)
POTASSIUM: 4.4 mmol/L (ref 3.5–5.1)
SODIUM: 138 mmol/L (ref 135–145)
Total Bilirubin: 0.5 mg/dL (ref 0.3–1.2)
Total Protein: 7.1 g/dL (ref 6.5–8.1)

## 2017-07-15 MED ORDER — DENOSUMAB 60 MG/ML ~~LOC~~ SOLN
60.0000 mg | Freq: Once | SUBCUTANEOUS | Status: AC
  Administered 2017-07-15: 60 mg via SUBCUTANEOUS
  Filled 2017-07-15: qty 1

## 2017-07-15 NOTE — Patient Instructions (Signed)
[place] at [facility]  Discharge Instructions:  You received Prolia today.  Keep scheduled appointments and call for any concerns or problems.  _______________________________________________________________  Thank you for choosing [place] at [facility] to provide your oncology and hematology care.  To afford each patient quality time with our providers, please arrive at least 15 minutes before your scheduled appointment.  You need to re-schedule your appointment if you arrive 10 or more minutes late.  We strive to give you quality time with our providers, and arriving late affects you and other patients whose appointments are after yours.  Also, if you no show three or more times for appointments you may be dismissed from the clinic.  Again, thank you for choosing [place] [facility] [place] hope is that these requests will allow you access to exceptional care and in a timely manner. _______________________________________________________________  If you have questions after your visit, please contact our office at [phone] between the hours of 8:30 a.m. and 5:00 p.m. Voicemails left after 4:30 p.m. will not be returned until the following business day. _______________________________________________________________  For prescription refill requests, have your pharmacy contact our office. _______________________________________________________________  Recommendations made by the consultant and any test results will be sent to your referring physician. _______________________________________________________________

## 2017-07-15 NOTE — Progress Notes (Signed)
[place]  Progress Note  Patient Care Team: Neale Burly, MD as PCP - General (Internal Medicine)  CHIEF COMPLAINTS/PURPOSE OF CONSULTATION:  Invasive ductal Carcinoma R Breast    Malignant neoplasm of upper-inner quadrant of right breast in female, estrogen receptor positive ([place])   09/08/2016 Mammogram    Postprocedure bilateral mammogram, for clip placements. Bilateral cylinder shaped biopsy clips appear well positioned at the sites of the targeted masses. R breast mass at the 1:00 position, L breast mass at the 8:00 position      09/08/2016 Initial Biopsy    Ultrasound guided biopsy of bilateral breast masses      09/08/2016 Pathology Results    R breast invasive ductal carcinoma, low grade with focal micro-calcifications. 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 ER+ 90%, PR 90% HER 2 IHC 2+, FISH NEGATIVE       HISTORY OF PRESENTING ILLNESS:  Kelly Fisher 83 y.o. female is referred to [facility] by Dr. Tye Maryland because of newly diagnosed R breast cancer, ER+ PR+ HER 2 -  Patient states she was checking her body for ticks when she noticed a lump and notified her PCP. Patient had mammogram which revealed right breast mass located at 1 o'clock axis and left breast mass located at the 8 o'clock axis. [place] underwent biopsies of left and right masses. Left breast negative for breast cancer. Right breast is invasive ductal ER+ /PR+/HER2 - by [facility]. She notes that she had yearly mammograms until about 10 years ago.   INTERVAL HISTORY: Patient presents to the clinic today for right breast cancer follow up with her husband and her brother in law. Patient states she continues to take Arimidex without any side effects. She reports still feeling a lump in her breast that has not changed in size recently. The patient reports she has difficulty sleeping at night, and sleeps occasionally during the day. She reports additional falls in her  home recently.   MEDICAL HISTORY:  Past Medical History:  Diagnosis Date  . CAD (coronary artery disease)   . High cholesterol   . Hypertension     SURGICAL HISTORY: Past Surgical History:  Procedure Laterality Date  . CORONARY ARTERY BYPASS GRAFT    . HIP FRACTURE SURGERY Left   . TOTAL KNEE ARTHROPLASTY Right     SOCIAL HISTORY: Social History   Social History  . Marital status: Married    Spouse name: N/A  . Number of children: N/A  . Years of education: N/A   Occupational History  . Not on file.   Social History Main Topics  . Smoking status: Never Smoker  . Smokeless tobacco: Never Used  . Alcohol use No  . Drug use: No  . Sexual activity: Not on file     Comment: married   Other Topics Concern  . Not on file   Social History Narrative  . No narrative on file  Married 72 years.  1 child. She is 40 years old. 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 Doesn't drink alcohol Raised in [facility] [place] and gardening were her hobbies.  FAMILY HISTORY: History reviewed. No pertinent family history. Mother passed at 37 from a stroke Father passed at 92 from a stroke 2 sisters deceased and 2 brothers deceased. Sisters both passed from stroke. Unsure of brothers' cause of death.  ALLERGIES:  has No Known Allergies.  MEDICATIONS:  Current Outpatient Prescriptions  Medication  Sig Dispense Refill  . allopurinol (ZYLOPRIM) 300 MG tablet Take 300 mg by mouth daily.    Marland Kitchen aspirin EC 81 MG tablet Take 81 mg by mouth daily.    . calcium-vitamin D (OSCAL WITH D) 500-200 MG-UNIT tablet Take 2 tablets by mouth daily with breakfast. 60 tablet 5  . clopidogrel (PLAVIX) 75 MG tablet Take 75 mg by mouth daily.    . ferrous sulfate 325 (65 FE) MG tablet Take 325 mg by mouth daily with breakfast.    . linagliptin (TRADJENTA) 5 MG TABS tablet Take 5 mg by mouth daily.    Marland Kitchen lisinopril (PRINIVIL,ZESTRIL) 5 MG tablet Take 5 mg by mouth  daily.    . metFORMIN (GLUCOPHAGE) 500 MG tablet Take 1,000 mg by mouth 2 (two) times daily.    . metoprolol tartrate (LOPRESSOR) 25 MG tablet Take 25 mg by mouth daily.    . pantoprazole (PROTONIX) 40 MG tablet Take 40 mg by mouth daily.    . simvastatin (ZOCOR) 40 MG tablet Take 40 mg by mouth.    . triamcinolone lotion (KENALOG) 0.1 % Apply 1 application topically 2 (two) times daily.     No current facility-administered medications for this visit.     Review of Systems  Constitutional: Positive for malaise/fatigue (Difficulty sleeping at night). Negative for weight loss.       Denies hot flashes  HENT: Negative.   Eyes: Negative.   Respiratory: Negative.  Negative for shortness of breath.   Cardiovascular: Negative for chest pain.  Gastrointestinal: Negative.  Negative for abdominal pain.  Musculoskeletal: Positive for falls (Several falls in her home recently).  Skin: Negative.   Neurological: Negative.   Endo/Heme/Allergies: Negative.   Psychiatric/Behavioral: Negative.   All other systems reviewed and are negative. 14 point ROS was done and is otherwise as detailed above or in HPI   PHYSICAL EXAMINATION:  ECOG PERFORMANCE STATUS: 1  Vitals:   07/15/17 1025  BP: (!) 113/92  Pulse: 60  Resp: 18  Temp: 98.2 F (36.8 C)  SpO2: 100%    Physical Exam  Constitutional: She is oriented to person, place, and time and well-developed, well-nourished, and in no distress. No distress.  HENT:  Head: Normocephalic and atraumatic.  Mouth/Throat: Oropharynx is clear and moist. No oropharyngeal exudate.  Eyes: Pupils are equal, round, and reactive to light. Conjunctivae and EOM are normal. Right eye exhibits no discharge. Left eye exhibits no discharge. No scleral icterus.  Neck: Normal range of motion. Neck supple. No tracheal deviation present. No thyromegaly present.  Cardiovascular: Normal rate, regular rhythm and normal heart sounds.   Pulmonary/Chest: Effort normal and  breath sounds normal. No respiratory distress. She has no wheezes.    Abdominal: Soft. Bowel sounds are normal. She exhibits no distension and no mass. There is no tenderness. There is no rebound and no guarding.  Musculoskeletal: Normal range of motion. She exhibits no edema.  Lymphadenopathy:    She has no cervical adenopathy.  Neurological: She is alert and oriented to person, place, and time. No cranial nerve deficit. Gait normal.  Skin: Skin is warm and dry. No rash noted. No erythema.  Psychiatric: Mood, memory, affect and judgment normal.  Nursing note and vitals reviewed.   LABORATORY DATA:  I have reviewed the data as listed Lab Results  Component Value Date   WBC 4.4 03/30/2017   HGB 11.1 (L) 03/30/2017   HCT 34.7 (L) 03/30/2017   MCV 92.0 03/30/2017   PLT 167 03/30/2017  CMP     Component Value Date/Time   NA 138 07/15/2017 0949   K 4.4 07/15/2017 0949   CL 104 07/15/2017 0949   CO2 28 07/15/2017 0949   GLUCOSE 110 (H) 07/15/2017 0949   BUN 16 07/15/2017 0949   CREATININE 0.73 07/15/2017 0949   CALCIUM 9.3 07/15/2017 0949   PROT 7.1 07/15/2017 0949   ALBUMIN 3.7 07/15/2017 0949   AST 25 07/15/2017 0949   ALT 15 07/15/2017 0949   ALKPHOS 52 07/15/2017 0949   BILITOT 0.5 07/15/2017 0949   GFRNONAA >60 07/15/2017 0949   GFRAA >60 07/15/2017 0949     RADIOGRAPHIC STUDIES: I have personally reviewed the radiological images as listed and agreed with the findings in the report. No results found.  Study Results  NUCLEAR MEDICINE PET SKULL BASE TO THIGH 09/27/2016  IMPRESSION: 1. Small hypermetabolic mass in the medial RIGHT breast presumed breast carcinoma. 2. No evidence of nodal metastasis within the RIGHT axilla or intrathoracic nodes. 3. Hypermetabolic RIGHT middle lobe pulmonary nodule increased mildly in size from 2014 is favored a low-grade neoplasm or inflammatory nodule. 4. Hypermetabolic activity in the substernal mediastinum and ascending  aorta is favored post procedural inflammation.   ASSESSMENT & PLAN:  Invasive ductal carcinoma of R breast ER+ PR+ HER 2 - Advanced Age RML pulmonary nodule DEXA 12/2016- osteoporosis  83 year old female with newly diagnosed Right ER+ PR+ HER 2 - infiltrating ductal carcinoma, tumor dimension no larger than 2 cm, easily palpable. Currently patient and her husband are reluctant to do surgery. Currently on arimidex.  PLAN: - I have ordered a diagnostic mammogram of her right breast to assess for evaluation of her breast mass. Again discussed surgery as the best option for cure for her breast cancer however they are still against surgery.  - Continue arimidex at this time. Patient is tolerating it well. - Continue Prolia injections q 4month for osteoporosis. She will get a prolia injection today.  - RTC for follow up in 3 months.  This note was electronically signed by:  LTwana First MD 07/15/2017 10:32 AM

## 2017-07-15 NOTE — Patient Instructions (Signed)
[facility] at [facility] Discharge Instructions  RECOMMENDATIONS MADE BY THE CONSULTANT AND ANY TEST RESULTS WILL BE SENT TO YOUR REFERRING PHYSICIAN.  You saw Dr. Zhou today.  Thank you for choosing [facility] at [facility] to provide your oncology and hematology care.  To afford each patient quality time with our provider, please arrive at least 15 minutes before your scheduled appointment time.    If you have a lab appointment with the Cancer Center please come in thru the  Main Entrance and check in at the main information desk  You need to re-schedule your appointment should you arrive 10 or more minutes late.  We strive to give you quality time with our providers, and arriving late affects you and other patients whose appointments are after yours.  Also, if you no show three or more times for appointments you may be dismissed from the clinic at the providers discretion.     Again, thank you for choosing [facility].  Our hope is that these requests will decrease the amount of time that you wait before being seen by our physicians.       _____________________________________________________________  Should you have questions after your visit to [facility], please contact our office at [phone] between the hours of 8:30 a.m. and 4:30 p.m.  Voicemails left after 4:30 p.m. will not be returned until the following business day.  For prescription refill requests, have your pharmacy contact our office.       Resources For Cancer Patients and their Caregivers ? American Cancer Society: Can assist with transportation, wigs, general needs, runs Look Good Feel Better.        1-888-227-6333 ? Cancer Care: Provides financial assistance, online support groups, medication/co-pay assistance.  1-800-813-HOPE (4673) ? [facility] Assists [place] cancer patients and their families through  emotional , educational and financial support.  336-427-4357 ? [place] DSS Where to apply for food stamps, Medicaid and utility assistance. 336-342-1394 ? RCATS: Transportation to medical appointments. 336-347-2287 ? Social Security Administration: May apply for disability if have a Stage IV cancer. 336-342-7796 1-800-772-1213 ? [place] Aging, Disability and Transit Services: Assists with nutrition, care and transit needs. 336-349-2343  Cancer Center Support Programs: @10RELATIVEDAYS@ > Cancer Support Group  2nd Tuesday of the month 1pm-2pm, Journey Room  > Creative Journey  3rd Tuesday of the month 1130am-1pm, Journey Room  > Look Good Feel Better  1st Wednesday of the month 10am-12 noon, Journey Room (Call American Cancer Society to register 1-800-395-5775)    

## 2017-07-15 NOTE — Progress Notes (Signed)
Patient tolerated Prolia shot with no complaints voiced.  Site clean and dry with band aid applied.  VSS with discharge and left in wheelchair with family.  Instructed to call for any problems with understanding voiced.

## 2017-07-19 DIAGNOSIS — E1140 Type 2 diabetes mellitus with diabetic neuropathy, unspecified: Secondary | ICD-10-CM | POA: Diagnosis not present

## 2017-07-19 DIAGNOSIS — E1151 Type 2 diabetes mellitus with diabetic peripheral angiopathy without gangrene: Secondary | ICD-10-CM | POA: Diagnosis not present

## 2017-07-26 ENCOUNTER — Ambulatory Visit (HOSPITAL_COMMUNITY)
Admission: RE | Admit: 2017-07-26 | Discharge: 2017-07-26 | Disposition: A | Discharge status: Home / Self Care | Payer: Medicare Other | Source: Ambulatory Visit | Attending: Oncology | Admitting: Oncology

## 2017-07-26 DIAGNOSIS — Z170 Estrogen receptor positive status [ER+]: Secondary | ICD-10-CM | POA: Diagnosis not present

## 2017-07-26 DIAGNOSIS — C50211 Malignant neoplasm of upper-inner quadrant of right female breast: Secondary | ICD-10-CM | POA: Insufficient documentation

## 2017-07-26 DIAGNOSIS — N6489 Other specified disorders of breast: Secondary | ICD-10-CM | POA: Diagnosis not present

## 2017-07-26 DIAGNOSIS — R922 Inconclusive mammogram: Secondary | ICD-10-CM | POA: Diagnosis not present

## 2017-08-11 DIAGNOSIS — G458 Other transient cerebral ischemic attacks and related syndromes: Secondary | ICD-10-CM | POA: Diagnosis not present

## 2017-08-11 DIAGNOSIS — I10 Essential (primary) hypertension: Secondary | ICD-10-CM | POA: Diagnosis not present

## 2017-08-11 DIAGNOSIS — E119 Type 2 diabetes mellitus without complications: Secondary | ICD-10-CM | POA: Diagnosis not present

## 2017-08-11 DIAGNOSIS — E784 Other hyperlipidemia: Secondary | ICD-10-CM | POA: Diagnosis not present

## 2017-08-22 DIAGNOSIS — E860 Dehydration: Secondary | ICD-10-CM | POA: Diagnosis not present

## 2017-08-22 DIAGNOSIS — S199XXA Unspecified injury of neck, initial encounter: Secondary | ICD-10-CM | POA: Diagnosis not present

## 2017-08-22 DIAGNOSIS — W228XXA Striking against or struck by other objects, initial encounter: Secondary | ICD-10-CM | POA: Diagnosis not present

## 2017-08-22 DIAGNOSIS — Z79899 Other long term (current) drug therapy: Secondary | ICD-10-CM | POA: Diagnosis not present

## 2017-08-22 DIAGNOSIS — Z952 Presence of prosthetic heart valve: Secondary | ICD-10-CM | POA: Diagnosis not present

## 2017-08-22 DIAGNOSIS — N390 Urinary tract infection, site not specified: Secondary | ICD-10-CM | POA: Diagnosis not present

## 2017-08-22 DIAGNOSIS — Z7982 Long term (current) use of aspirin: Secondary | ICD-10-CM | POA: Diagnosis not present

## 2017-08-22 DIAGNOSIS — I110 Hypertensive heart disease with heart failure: Secondary | ICD-10-CM | POA: Diagnosis not present

## 2017-08-22 DIAGNOSIS — S0990XA Unspecified injury of head, initial encounter: Secondary | ICD-10-CM | POA: Diagnosis not present

## 2017-08-22 DIAGNOSIS — Z951 Presence of aortocoronary bypass graft: Secondary | ICD-10-CM | POA: Diagnosis not present

## 2017-08-22 DIAGNOSIS — I2510 Atherosclerotic heart disease of native coronary artery without angina pectoris: Secondary | ICD-10-CM | POA: Diagnosis not present

## 2017-08-22 DIAGNOSIS — S0003XA Contusion of scalp, initial encounter: Secondary | ICD-10-CM | POA: Diagnosis not present

## 2017-08-22 DIAGNOSIS — E785 Hyperlipidemia, unspecified: Secondary | ICD-10-CM | POA: Diagnosis not present

## 2017-08-22 DIAGNOSIS — I5032 Chronic diastolic (congestive) heart failure: Secondary | ICD-10-CM | POA: Diagnosis not present

## 2017-08-22 DIAGNOSIS — E119 Type 2 diabetes mellitus without complications: Secondary | ICD-10-CM | POA: Diagnosis not present

## 2017-08-22 DIAGNOSIS — Z780 Asymptomatic menopausal state: Secondary | ICD-10-CM | POA: Diagnosis not present

## 2017-08-22 DIAGNOSIS — R55 Syncope and collapse: Secondary | ICD-10-CM | POA: Diagnosis not present

## 2017-08-22 DIAGNOSIS — K449 Diaphragmatic hernia without obstruction or gangrene: Secondary | ICD-10-CM | POA: Diagnosis not present

## 2017-08-22 DIAGNOSIS — Z7984 Long term (current) use of oral hypoglycemic drugs: Secondary | ICD-10-CM | POA: Diagnosis not present

## 2017-08-22 DIAGNOSIS — R262 Difficulty in walking, not elsewhere classified: Secondary | ICD-10-CM | POA: Diagnosis not present

## 2017-08-22 DIAGNOSIS — C50911 Malignant neoplasm of unspecified site of right female breast: Secondary | ICD-10-CM | POA: Diagnosis not present

## 2017-08-23 DIAGNOSIS — R55 Syncope and collapse: Secondary | ICD-10-CM | POA: Diagnosis not present

## 2017-08-23 DIAGNOSIS — E860 Dehydration: Secondary | ICD-10-CM | POA: Diagnosis not present

## 2017-08-23 DIAGNOSIS — Z952 Presence of prosthetic heart valve: Secondary | ICD-10-CM | POA: Diagnosis not present

## 2017-08-23 DIAGNOSIS — N390 Urinary tract infection, site not specified: Secondary | ICD-10-CM | POA: Diagnosis not present

## 2017-08-23 DIAGNOSIS — E119 Type 2 diabetes mellitus without complications: Secondary | ICD-10-CM | POA: Diagnosis not present

## 2017-08-24 DIAGNOSIS — E119 Type 2 diabetes mellitus without complications: Secondary | ICD-10-CM | POA: Diagnosis not present

## 2017-08-24 DIAGNOSIS — N390 Urinary tract infection, site not specified: Secondary | ICD-10-CM | POA: Diagnosis not present

## 2017-08-24 DIAGNOSIS — E860 Dehydration: Secondary | ICD-10-CM | POA: Diagnosis not present

## 2017-08-24 DIAGNOSIS — Z952 Presence of prosthetic heart valve: Secondary | ICD-10-CM | POA: Diagnosis not present

## 2017-08-24 DIAGNOSIS — R55 Syncope and collapse: Secondary | ICD-10-CM | POA: Diagnosis not present

## 2017-08-25 DIAGNOSIS — I110 Hypertensive heart disease with heart failure: Secondary | ICD-10-CM | POA: Diagnosis not present

## 2017-08-25 DIAGNOSIS — I5030 Unspecified diastolic (congestive) heart failure: Secondary | ICD-10-CM | POA: Diagnosis not present

## 2017-08-25 DIAGNOSIS — Z951 Presence of aortocoronary bypass graft: Secondary | ICD-10-CM | POA: Diagnosis not present

## 2017-08-25 DIAGNOSIS — Z952 Presence of prosthetic heart valve: Secondary | ICD-10-CM | POA: Diagnosis not present

## 2017-08-25 DIAGNOSIS — S0003XD Contusion of scalp, subsequent encounter: Secondary | ICD-10-CM | POA: Diagnosis not present

## 2017-08-25 DIAGNOSIS — E119 Type 2 diabetes mellitus without complications: Secondary | ICD-10-CM | POA: Diagnosis not present

## 2017-08-25 DIAGNOSIS — K5730 Diverticulosis of large intestine without perforation or abscess without bleeding: Secondary | ICD-10-CM | POA: Diagnosis not present

## 2017-08-25 DIAGNOSIS — Z9181 History of falling: Secondary | ICD-10-CM | POA: Diagnosis not present

## 2017-08-25 DIAGNOSIS — I2510 Atherosclerotic heart disease of native coronary artery without angina pectoris: Secondary | ICD-10-CM | POA: Diagnosis not present

## 2017-08-25 DIAGNOSIS — N390 Urinary tract infection, site not specified: Secondary | ICD-10-CM | POA: Diagnosis not present

## 2017-08-25 DIAGNOSIS — E785 Hyperlipidemia, unspecified: Secondary | ICD-10-CM | POA: Diagnosis not present

## 2017-08-30 DIAGNOSIS — I2510 Atherosclerotic heart disease of native coronary artery without angina pectoris: Secondary | ICD-10-CM | POA: Diagnosis not present

## 2017-08-30 DIAGNOSIS — I110 Hypertensive heart disease with heart failure: Secondary | ICD-10-CM | POA: Diagnosis not present

## 2017-08-30 DIAGNOSIS — I5030 Unspecified diastolic (congestive) heart failure: Secondary | ICD-10-CM | POA: Diagnosis not present

## 2017-08-30 DIAGNOSIS — E119 Type 2 diabetes mellitus without complications: Secondary | ICD-10-CM | POA: Diagnosis not present

## 2017-08-30 DIAGNOSIS — N390 Urinary tract infection, site not specified: Secondary | ICD-10-CM | POA: Diagnosis not present

## 2017-08-30 DIAGNOSIS — S0003XD Contusion of scalp, subsequent encounter: Secondary | ICD-10-CM | POA: Diagnosis not present

## 2017-09-05 DIAGNOSIS — S0003XD Contusion of scalp, subsequent encounter: Secondary | ICD-10-CM | POA: Diagnosis not present

## 2017-09-05 DIAGNOSIS — I5030 Unspecified diastolic (congestive) heart failure: Secondary | ICD-10-CM | POA: Diagnosis not present

## 2017-09-05 DIAGNOSIS — I110 Hypertensive heart disease with heart failure: Secondary | ICD-10-CM | POA: Diagnosis not present

## 2017-09-05 DIAGNOSIS — E119 Type 2 diabetes mellitus without complications: Secondary | ICD-10-CM | POA: Diagnosis not present

## 2017-09-05 DIAGNOSIS — I2510 Atherosclerotic heart disease of native coronary artery without angina pectoris: Secondary | ICD-10-CM | POA: Diagnosis not present

## 2017-09-05 DIAGNOSIS — N390 Urinary tract infection, site not specified: Secondary | ICD-10-CM | POA: Diagnosis not present

## 2017-09-06 DIAGNOSIS — R55 Syncope and collapse: Secondary | ICD-10-CM | POA: Diagnosis not present

## 2017-09-06 DIAGNOSIS — M160 Bilateral primary osteoarthritis of hip: Secondary | ICD-10-CM | POA: Diagnosis not present

## 2017-09-08 DIAGNOSIS — E119 Type 2 diabetes mellitus without complications: Secondary | ICD-10-CM | POA: Diagnosis not present

## 2017-09-08 DIAGNOSIS — N390 Urinary tract infection, site not specified: Secondary | ICD-10-CM | POA: Diagnosis not present

## 2017-09-08 DIAGNOSIS — S0003XD Contusion of scalp, subsequent encounter: Secondary | ICD-10-CM | POA: Diagnosis not present

## 2017-09-08 DIAGNOSIS — I110 Hypertensive heart disease with heart failure: Secondary | ICD-10-CM | POA: Diagnosis not present

## 2017-09-08 DIAGNOSIS — I5030 Unspecified diastolic (congestive) heart failure: Secondary | ICD-10-CM | POA: Diagnosis not present

## 2017-09-08 DIAGNOSIS — I2510 Atherosclerotic heart disease of native coronary artery without angina pectoris: Secondary | ICD-10-CM | POA: Diagnosis not present

## 2017-09-09 DIAGNOSIS — I5030 Unspecified diastolic (congestive) heart failure: Secondary | ICD-10-CM | POA: Diagnosis not present

## 2017-09-09 DIAGNOSIS — I110 Hypertensive heart disease with heart failure: Secondary | ICD-10-CM | POA: Diagnosis not present

## 2017-09-09 DIAGNOSIS — S0003XD Contusion of scalp, subsequent encounter: Secondary | ICD-10-CM | POA: Diagnosis not present

## 2017-09-09 DIAGNOSIS — I2510 Atherosclerotic heart disease of native coronary artery without angina pectoris: Secondary | ICD-10-CM | POA: Diagnosis not present

## 2017-09-09 DIAGNOSIS — N390 Urinary tract infection, site not specified: Secondary | ICD-10-CM | POA: Diagnosis not present

## 2017-09-09 DIAGNOSIS — E119 Type 2 diabetes mellitus without complications: Secondary | ICD-10-CM | POA: Diagnosis not present

## 2017-09-12 DIAGNOSIS — I5030 Unspecified diastolic (congestive) heart failure: Secondary | ICD-10-CM | POA: Diagnosis not present

## 2017-09-12 DIAGNOSIS — I2510 Atherosclerotic heart disease of native coronary artery without angina pectoris: Secondary | ICD-10-CM | POA: Diagnosis not present

## 2017-09-12 DIAGNOSIS — N390 Urinary tract infection, site not specified: Secondary | ICD-10-CM | POA: Diagnosis not present

## 2017-09-12 DIAGNOSIS — I110 Hypertensive heart disease with heart failure: Secondary | ICD-10-CM | POA: Diagnosis not present

## 2017-09-12 DIAGNOSIS — E119 Type 2 diabetes mellitus without complications: Secondary | ICD-10-CM | POA: Diagnosis not present

## 2017-09-12 DIAGNOSIS — S0003XD Contusion of scalp, subsequent encounter: Secondary | ICD-10-CM | POA: Diagnosis not present

## 2017-09-14 DIAGNOSIS — I110 Hypertensive heart disease with heart failure: Secondary | ICD-10-CM | POA: Diagnosis not present

## 2017-09-14 DIAGNOSIS — S0003XD Contusion of scalp, subsequent encounter: Secondary | ICD-10-CM | POA: Diagnosis not present

## 2017-09-14 DIAGNOSIS — I5030 Unspecified diastolic (congestive) heart failure: Secondary | ICD-10-CM | POA: Diagnosis not present

## 2017-09-14 DIAGNOSIS — N390 Urinary tract infection, site not specified: Secondary | ICD-10-CM | POA: Diagnosis not present

## 2017-09-14 DIAGNOSIS — I2510 Atherosclerotic heart disease of native coronary artery without angina pectoris: Secondary | ICD-10-CM | POA: Diagnosis not present

## 2017-09-14 DIAGNOSIS — E119 Type 2 diabetes mellitus without complications: Secondary | ICD-10-CM | POA: Diagnosis not present

## 2017-09-15 DIAGNOSIS — E119 Type 2 diabetes mellitus without complications: Secondary | ICD-10-CM | POA: Diagnosis not present

## 2017-09-15 DIAGNOSIS — N390 Urinary tract infection, site not specified: Secondary | ICD-10-CM | POA: Diagnosis not present

## 2017-09-15 DIAGNOSIS — I5030 Unspecified diastolic (congestive) heart failure: Secondary | ICD-10-CM | POA: Diagnosis not present

## 2017-09-15 DIAGNOSIS — S0003XD Contusion of scalp, subsequent encounter: Secondary | ICD-10-CM | POA: Diagnosis not present

## 2017-09-15 DIAGNOSIS — I2510 Atherosclerotic heart disease of native coronary artery without angina pectoris: Secondary | ICD-10-CM | POA: Diagnosis not present

## 2017-09-15 DIAGNOSIS — I110 Hypertensive heart disease with heart failure: Secondary | ICD-10-CM | POA: Diagnosis not present

## 2017-09-16 DIAGNOSIS — I2510 Atherosclerotic heart disease of native coronary artery without angina pectoris: Secondary | ICD-10-CM | POA: Diagnosis not present

## 2017-09-16 DIAGNOSIS — I5030 Unspecified diastolic (congestive) heart failure: Secondary | ICD-10-CM | POA: Diagnosis not present

## 2017-09-16 DIAGNOSIS — E119 Type 2 diabetes mellitus without complications: Secondary | ICD-10-CM | POA: Diagnosis not present

## 2017-09-16 DIAGNOSIS — S0003XD Contusion of scalp, subsequent encounter: Secondary | ICD-10-CM | POA: Diagnosis not present

## 2017-09-16 DIAGNOSIS — I110 Hypertensive heart disease with heart failure: Secondary | ICD-10-CM | POA: Diagnosis not present

## 2017-09-16 DIAGNOSIS — N390 Urinary tract infection, site not specified: Secondary | ICD-10-CM | POA: Diagnosis not present

## 2017-09-19 DIAGNOSIS — I110 Hypertensive heart disease with heart failure: Secondary | ICD-10-CM | POA: Diagnosis not present

## 2017-09-19 DIAGNOSIS — I2510 Atherosclerotic heart disease of native coronary artery without angina pectoris: Secondary | ICD-10-CM | POA: Diagnosis not present

## 2017-09-19 DIAGNOSIS — E119 Type 2 diabetes mellitus without complications: Secondary | ICD-10-CM | POA: Diagnosis not present

## 2017-09-19 DIAGNOSIS — I5030 Unspecified diastolic (congestive) heart failure: Secondary | ICD-10-CM | POA: Diagnosis not present

## 2017-09-19 DIAGNOSIS — N390 Urinary tract infection, site not specified: Secondary | ICD-10-CM | POA: Diagnosis not present

## 2017-09-19 DIAGNOSIS — S0003XD Contusion of scalp, subsequent encounter: Secondary | ICD-10-CM | POA: Diagnosis not present

## 2017-09-20 DIAGNOSIS — I110 Hypertensive heart disease with heart failure: Secondary | ICD-10-CM | POA: Diagnosis not present

## 2017-09-20 DIAGNOSIS — E119 Type 2 diabetes mellitus without complications: Secondary | ICD-10-CM | POA: Diagnosis not present

## 2017-09-20 DIAGNOSIS — I2510 Atherosclerotic heart disease of native coronary artery without angina pectoris: Secondary | ICD-10-CM | POA: Diagnosis not present

## 2017-09-20 DIAGNOSIS — S0003XD Contusion of scalp, subsequent encounter: Secondary | ICD-10-CM | POA: Diagnosis not present

## 2017-09-20 DIAGNOSIS — I5030 Unspecified diastolic (congestive) heart failure: Secondary | ICD-10-CM | POA: Diagnosis not present

## 2017-09-20 DIAGNOSIS — N390 Urinary tract infection, site not specified: Secondary | ICD-10-CM | POA: Diagnosis not present

## 2017-09-22 DIAGNOSIS — I110 Hypertensive heart disease with heart failure: Secondary | ICD-10-CM | POA: Diagnosis not present

## 2017-09-22 DIAGNOSIS — I5030 Unspecified diastolic (congestive) heart failure: Secondary | ICD-10-CM | POA: Diagnosis not present

## 2017-09-22 DIAGNOSIS — S0003XD Contusion of scalp, subsequent encounter: Secondary | ICD-10-CM | POA: Diagnosis not present

## 2017-09-22 DIAGNOSIS — E119 Type 2 diabetes mellitus without complications: Secondary | ICD-10-CM | POA: Diagnosis not present

## 2017-09-22 DIAGNOSIS — N390 Urinary tract infection, site not specified: Secondary | ICD-10-CM | POA: Diagnosis not present

## 2017-09-22 DIAGNOSIS — I2510 Atherosclerotic heart disease of native coronary artery without angina pectoris: Secondary | ICD-10-CM | POA: Diagnosis not present

## 2017-09-26 DIAGNOSIS — E119 Type 2 diabetes mellitus without complications: Secondary | ICD-10-CM | POA: Diagnosis not present

## 2017-09-26 DIAGNOSIS — N390 Urinary tract infection, site not specified: Secondary | ICD-10-CM | POA: Diagnosis not present

## 2017-09-26 DIAGNOSIS — I5030 Unspecified diastolic (congestive) heart failure: Secondary | ICD-10-CM | POA: Diagnosis not present

## 2017-09-26 DIAGNOSIS — I2510 Atherosclerotic heart disease of native coronary artery without angina pectoris: Secondary | ICD-10-CM | POA: Diagnosis not present

## 2017-09-26 DIAGNOSIS — S0003XD Contusion of scalp, subsequent encounter: Secondary | ICD-10-CM | POA: Diagnosis not present

## 2017-09-26 DIAGNOSIS — I110 Hypertensive heart disease with heart failure: Secondary | ICD-10-CM | POA: Diagnosis not present

## 2017-09-27 DIAGNOSIS — I5030 Unspecified diastolic (congestive) heart failure: Secondary | ICD-10-CM | POA: Diagnosis not present

## 2017-09-27 DIAGNOSIS — N390 Urinary tract infection, site not specified: Secondary | ICD-10-CM | POA: Diagnosis not present

## 2017-09-27 DIAGNOSIS — S0003XD Contusion of scalp, subsequent encounter: Secondary | ICD-10-CM | POA: Diagnosis not present

## 2017-09-27 DIAGNOSIS — E119 Type 2 diabetes mellitus without complications: Secondary | ICD-10-CM | POA: Diagnosis not present

## 2017-09-27 DIAGNOSIS — M25561 Pain in right knee: Secondary | ICD-10-CM | POA: Diagnosis not present

## 2017-09-27 DIAGNOSIS — I110 Hypertensive heart disease with heart failure: Secondary | ICD-10-CM | POA: Diagnosis not present

## 2017-09-27 DIAGNOSIS — I2510 Atherosclerotic heart disease of native coronary artery without angina pectoris: Secondary | ICD-10-CM | POA: Diagnosis not present

## 2017-09-27 DIAGNOSIS — M25511 Pain in right shoulder: Secondary | ICD-10-CM | POA: Diagnosis not present

## 2017-09-28 DIAGNOSIS — E119 Type 2 diabetes mellitus without complications: Secondary | ICD-10-CM | POA: Diagnosis not present

## 2017-09-28 DIAGNOSIS — S0003XD Contusion of scalp, subsequent encounter: Secondary | ICD-10-CM | POA: Diagnosis not present

## 2017-09-28 DIAGNOSIS — I2510 Atherosclerotic heart disease of native coronary artery without angina pectoris: Secondary | ICD-10-CM | POA: Diagnosis not present

## 2017-09-28 DIAGNOSIS — N390 Urinary tract infection, site not specified: Secondary | ICD-10-CM | POA: Diagnosis not present

## 2017-09-28 DIAGNOSIS — I110 Hypertensive heart disease with heart failure: Secondary | ICD-10-CM | POA: Diagnosis not present

## 2017-09-28 DIAGNOSIS — I5030 Unspecified diastolic (congestive) heart failure: Secondary | ICD-10-CM | POA: Diagnosis not present

## 2017-09-30 DIAGNOSIS — I5030 Unspecified diastolic (congestive) heart failure: Secondary | ICD-10-CM | POA: Diagnosis not present

## 2017-09-30 DIAGNOSIS — S0003XD Contusion of scalp, subsequent encounter: Secondary | ICD-10-CM | POA: Diagnosis not present

## 2017-09-30 DIAGNOSIS — E119 Type 2 diabetes mellitus without complications: Secondary | ICD-10-CM | POA: Diagnosis not present

## 2017-09-30 DIAGNOSIS — I110 Hypertensive heart disease with heart failure: Secondary | ICD-10-CM | POA: Diagnosis not present

## 2017-09-30 DIAGNOSIS — I2510 Atherosclerotic heart disease of native coronary artery without angina pectoris: Secondary | ICD-10-CM | POA: Diagnosis not present

## 2017-09-30 DIAGNOSIS — N390 Urinary tract infection, site not specified: Secondary | ICD-10-CM | POA: Diagnosis not present

## 2017-10-03 DIAGNOSIS — I2510 Atherosclerotic heart disease of native coronary artery without angina pectoris: Secondary | ICD-10-CM | POA: Diagnosis not present

## 2017-10-03 DIAGNOSIS — N390 Urinary tract infection, site not specified: Secondary | ICD-10-CM | POA: Diagnosis not present

## 2017-10-03 DIAGNOSIS — S0003XD Contusion of scalp, subsequent encounter: Secondary | ICD-10-CM | POA: Diagnosis not present

## 2017-10-03 DIAGNOSIS — E119 Type 2 diabetes mellitus without complications: Secondary | ICD-10-CM | POA: Diagnosis not present

## 2017-10-03 DIAGNOSIS — I5030 Unspecified diastolic (congestive) heart failure: Secondary | ICD-10-CM | POA: Diagnosis not present

## 2017-10-03 DIAGNOSIS — I10 Essential (primary) hypertension: Secondary | ICD-10-CM | POA: Diagnosis not present

## 2017-10-03 DIAGNOSIS — I110 Hypertensive heart disease with heart failure: Secondary | ICD-10-CM | POA: Diagnosis not present

## 2017-10-04 DIAGNOSIS — E1140 Type 2 diabetes mellitus with diabetic neuropathy, unspecified: Secondary | ICD-10-CM | POA: Diagnosis not present

## 2017-10-04 DIAGNOSIS — E1151 Type 2 diabetes mellitus with diabetic peripheral angiopathy without gangrene: Secondary | ICD-10-CM | POA: Diagnosis not present

## 2017-10-05 DIAGNOSIS — I5030 Unspecified diastolic (congestive) heart failure: Secondary | ICD-10-CM | POA: Diagnosis not present

## 2017-10-05 DIAGNOSIS — I110 Hypertensive heart disease with heart failure: Secondary | ICD-10-CM | POA: Diagnosis not present

## 2017-10-05 DIAGNOSIS — E119 Type 2 diabetes mellitus without complications: Secondary | ICD-10-CM | POA: Diagnosis not present

## 2017-10-05 DIAGNOSIS — I2510 Atherosclerotic heart disease of native coronary artery without angina pectoris: Secondary | ICD-10-CM | POA: Diagnosis not present

## 2017-10-05 DIAGNOSIS — S0003XD Contusion of scalp, subsequent encounter: Secondary | ICD-10-CM | POA: Diagnosis not present

## 2017-10-05 DIAGNOSIS — N390 Urinary tract infection, site not specified: Secondary | ICD-10-CM | POA: Diagnosis not present

## 2017-10-07 DIAGNOSIS — N390 Urinary tract infection, site not specified: Secondary | ICD-10-CM | POA: Diagnosis not present

## 2017-10-07 DIAGNOSIS — I110 Hypertensive heart disease with heart failure: Secondary | ICD-10-CM | POA: Diagnosis not present

## 2017-10-07 DIAGNOSIS — E119 Type 2 diabetes mellitus without complications: Secondary | ICD-10-CM | POA: Diagnosis not present

## 2017-10-07 DIAGNOSIS — I5030 Unspecified diastolic (congestive) heart failure: Secondary | ICD-10-CM | POA: Diagnosis not present

## 2017-10-07 DIAGNOSIS — I2510 Atherosclerotic heart disease of native coronary artery without angina pectoris: Secondary | ICD-10-CM | POA: Diagnosis not present

## 2017-10-07 DIAGNOSIS — S0003XD Contusion of scalp, subsequent encounter: Secondary | ICD-10-CM | POA: Diagnosis not present

## 2017-10-10 DIAGNOSIS — S0003XD Contusion of scalp, subsequent encounter: Secondary | ICD-10-CM | POA: Diagnosis not present

## 2017-10-10 DIAGNOSIS — N390 Urinary tract infection, site not specified: Secondary | ICD-10-CM | POA: Diagnosis not present

## 2017-10-10 DIAGNOSIS — I110 Hypertensive heart disease with heart failure: Secondary | ICD-10-CM | POA: Diagnosis not present

## 2017-10-10 DIAGNOSIS — I5030 Unspecified diastolic (congestive) heart failure: Secondary | ICD-10-CM | POA: Diagnosis not present

## 2017-10-10 DIAGNOSIS — I2510 Atherosclerotic heart disease of native coronary artery without angina pectoris: Secondary | ICD-10-CM | POA: Diagnosis not present

## 2017-10-10 DIAGNOSIS — E119 Type 2 diabetes mellitus without complications: Secondary | ICD-10-CM | POA: Diagnosis not present

## 2017-10-11 DIAGNOSIS — E119 Type 2 diabetes mellitus without complications: Secondary | ICD-10-CM | POA: Diagnosis not present

## 2017-10-11 DIAGNOSIS — I110 Hypertensive heart disease with heart failure: Secondary | ICD-10-CM | POA: Diagnosis not present

## 2017-10-11 DIAGNOSIS — N390 Urinary tract infection, site not specified: Secondary | ICD-10-CM | POA: Diagnosis not present

## 2017-10-11 DIAGNOSIS — I2510 Atherosclerotic heart disease of native coronary artery without angina pectoris: Secondary | ICD-10-CM | POA: Diagnosis not present

## 2017-10-11 DIAGNOSIS — S0003XD Contusion of scalp, subsequent encounter: Secondary | ICD-10-CM | POA: Diagnosis not present

## 2017-10-11 DIAGNOSIS — I5030 Unspecified diastolic (congestive) heart failure: Secondary | ICD-10-CM | POA: Diagnosis not present

## 2017-10-19 ENCOUNTER — Encounter (HOSPITAL_COMMUNITY): Payer: Self-pay

## 2017-10-19 ENCOUNTER — Other Ambulatory Visit: Payer: Self-pay

## 2017-10-19 ENCOUNTER — Encounter (HOSPITAL_COMMUNITY): Payer: Medicare Other | Attending: Oncology | Admitting: Oncology

## 2017-10-19 VITALS — BP 154/58 | HR 71 | Temp 99.0°F | Resp 18 | Ht 64.0 in | Wt 112.0 lb

## 2017-10-19 DIAGNOSIS — Z170 Estrogen receptor positive status [ER+]: Secondary | ICD-10-CM

## 2017-10-19 DIAGNOSIS — M810 Age-related osteoporosis without current pathological fracture: Secondary | ICD-10-CM

## 2017-10-19 DIAGNOSIS — C50211 Malignant neoplasm of upper-inner quadrant of right female breast: Secondary | ICD-10-CM

## 2017-10-19 DIAGNOSIS — Z79811 Long term (current) use of aromatase inhibitors: Secondary | ICD-10-CM

## 2017-10-19 NOTE — Progress Notes (Signed)
[facility]  Progress Note  Patient Care Team: Neale Burly, MD as PCP - General (Internal Medicine)  CHIEF COMPLAINTS/PURPOSE OF CONSULTATION:  Invasive ductal Carcinoma R Breast    Malignant neoplasm of upper-inner quadrant of right breast in female, estrogen receptor positive ([place])   09/08/2016 Mammogram    Postprocedure bilateral mammogram, for clip placements. Bilateral cylinder shaped biopsy clips appear well positioned at the sites of the targeted masses. R breast mass at the 1:00 position, L breast mass at the 8:00 position      09/08/2016 Initial Biopsy    Ultrasound guided biopsy of bilateral breast masses      09/08/2016 Pathology Results    R breast invasive ductal carcinoma, low grade with focal micro-calcifications. 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 ER+ 90%, PR 90% HER 2 IHC 2+, FISH NEGATIVE       HISTORY OF PRESENTING ILLNESS:  Kelly Fisher 83 y.o. female is referred to [facility] by Dr. Tye [place] because of newly diagnosed R breast cancer, ER+ PR+ HER 2 -  Patient states she was checking her body for ticks when she noticed a lump and notified her PCP. Patient had mammogram which revealed right breast mass located at 1 o'clock axis and left breast mass located at the 8 o'clock axis. Leonette underwent biopsies of left and right masses. Left breast negative for breast cancer. Right breast is invasive ductal ER+ /PR+/HER2 - by [facility]. She notes that she had yearly mammograms until about 10 years ago.   INTERVAL HISTORY: Patient presents to the clinic today for right breast cancer follow up with her husband and her brother in law. Patient had diagnostic right mammogram with ultrasound on 07/26/2017 which demonstrated response to therapy with decrease in size of her breast mass, now measuring 1.6 x 1 x 1.4 cm, previously 2.3 x 1.1 x 1.8 cm.  She states overall she has been doing well except that she is fallen a few times  in the past few months.  She is taking her arimidex and calcium vitamin D without any problems.  She last received Prolia on 07/15/2017.  She states that she gets some hot flashes from the Arimidex but otherwise is tolerating it well.  She denies any chest pain, shortness of breath, abdominal pain, nausea, vomiting, diarrhea.   MEDICAL HISTORY:  Past Medical History:  Diagnosis Date  . CAD (coronary artery disease)   . High cholesterol   . Hypertension     SURGICAL HISTORY: Past Surgical History:  Procedure Laterality Date  . CORONARY ARTERY BYPASS GRAFT    . HIP FRACTURE SURGERY Left   . TOTAL KNEE ARTHROPLASTY Right     SOCIAL HISTORY: Social History   Socioeconomic History  . Marital status: Married    Spouse name: Not on file  . Number of children: Not on file  . Years of education: Not on file  . Highest education level: Not on file  Social Needs  . Financial resource strain: Not on file  . Food insecurity - worry: Not on file  . Food insecurity - inability: Not on file  . Transportation needs - medical: Not on file  . Transportation needs - non-medical: Not on file  Occupational History  . Not on file  Tobacco Use  . Smoking status: Never Smoker  . Smokeless tobacco: Never Used  Substance and Sexual Activity  . Alcohol use: No  . Drug use: No  . Sexual activity:  Not on file    Comment: married  Other Topics Concern  . Not on file  Social History Narrative  . Not on file  Married 72 years.  1 child. She is 70 years old. 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 Doesn't drink alcohol Raised in [place] and gardening were her hobbies.  FAMILY HISTORY: History reviewed. No pertinent family history. Mother passed at 60 from a stroke Father passed at 29 from a stroke 2 sisters deceased and 2 brothers deceased. Sisters both passed from stroke. Unsure of brothers' cause of death.  ALLERGIES:  has No Known  Allergies.  MEDICATIONS:  Current Outpatient Medications  Medication Sig Dispense Refill  . allopurinol (ZYLOPRIM) 300 MG tablet Take 300 mg by mouth daily.    Marland Kitchen aspirin EC 81 MG tablet Take 81 mg by mouth daily.    . calcium-vitamin D (OSCAL WITH D) 500-200 MG-UNIT tablet Take 2 tablets by mouth daily with breakfast. 60 tablet 5  . clopidogrel (PLAVIX) 75 MG tablet Take 75 mg by mouth daily.    . ferrous sulfate 325 (65 FE) MG tablet Take 325 mg by mouth daily with breakfast.    . linagliptin (TRADJENTA) 5 MG TABS tablet Take 5 mg by mouth daily.    Marland Kitchen lisinopril (PRINIVIL,ZESTRIL) 5 MG tablet Take 5 mg by mouth daily.    . metFORMIN (GLUCOPHAGE) 500 MG tablet Take 1,000 mg by mouth 2 (two) times daily.    . metoprolol tartrate (LOPRESSOR) 25 MG tablet Take 25 mg by mouth daily.    . pantoprazole (PROTONIX) 40 MG tablet Take 40 mg by mouth daily.    . simvastatin (ZOCOR) 40 MG tablet Take 40 mg by mouth.    . triamcinolone lotion (KENALOG) 0.1 % Apply 1 application topically 2 (two) times daily.     No current facility-administered medications for this visit.     Review of Systems  Constitutional: Positive for malaise/fatigue. Negative for weight loss.       Denies hot flashes  HENT: Negative.   Eyes: Negative.   Respiratory: Negative.  Negative for shortness of breath.   Cardiovascular: Negative for chest pain.  Gastrointestinal: Negative.  Negative for abdominal pain.  Musculoskeletal: Positive for falls (Several falls in her home recently).  Skin: Negative.   Neurological: Negative.   Endo/Heme/Allergies: Negative.   Psychiatric/Behavioral: Negative.   All other systems reviewed and are negative. 14 point ROS was done and is otherwise as detailed above or in HPI   PHYSICAL EXAMINATION:  ECOG PERFORMANCE STATUS: 1  Vitals:   10/19/17 1132  BP: (!) 154/58  Pulse: 71  Resp: 18  Temp: 99 F (37.2 C)  SpO2: 100%    Physical Exam  Constitutional: She is oriented to  person, place, and time and well-developed, well-nourished, and in no distress. No distress.  HENT:  Head: Normocephalic and atraumatic.  Mouth/Throat: Oropharynx is clear and moist. No oropharyngeal exudate.  Eyes: Conjunctivae and EOM are normal. Pupils are equal, round, and reactive to light. Right eye exhibits no discharge. Left eye exhibits no discharge. No scleral icterus.  Neck: Normal range of motion. Neck supple. No tracheal deviation present. No thyromegaly present.  Cardiovascular: Normal rate, regular rhythm and normal heart sounds.  Pulmonary/Chest: Effort normal and breath sounds normal. No respiratory distress. She has no wheezes.  Abdominal: Soft. Bowel sounds are normal. She exhibits no distension and no mass. There is no tenderness. There is no rebound  and no guarding.  Musculoskeletal: Normal range of motion. She exhibits no edema.  Lymphadenopathy:    She has no cervical adenopathy.  Neurological: She is alert and oriented to person, place, and time. No cranial nerve deficit. Gait normal.  Skin: Skin is warm and dry. No rash noted. No erythema.  Psychiatric: Mood, memory, affect and judgment normal.  Nursing note and vitals reviewed. Breast exam deferred today per patient's wishes.  LABORATORY DATA:  I have reviewed the data as listed Lab Results  Component Value Date   WBC 4.4 03/30/2017   HGB 11.1 (L) 03/30/2017   HCT 34.7 (L) 03/30/2017   MCV 92.0 03/30/2017   PLT 167 03/30/2017   CMP     Component Value Date/Time   NA 138 07/15/2017 0949   K 4.4 07/15/2017 0949   CL 104 07/15/2017 0949   CO2 28 07/15/2017 0949   GLUCOSE 110 (H) 07/15/2017 0949   BUN 16 07/15/2017 0949   CREATININE 0.73 07/15/2017 0949   CALCIUM 9.3 07/15/2017 0949   PROT 7.1 07/15/2017 0949   ALBUMIN 3.7 07/15/2017 0949   AST 25 07/15/2017 0949   ALT 15 07/15/2017 0949   ALKPHOS 52 07/15/2017 0949   BILITOT 0.5 07/15/2017 0949   GFRNONAA >60 07/15/2017 0949   GFRAA >60 07/15/2017  0949     RADIOGRAPHIC STUDIES: I have personally reviewed the radiological images as listed and agreed with the findings in the report. No results found.  Study Results  NUCLEAR MEDICINE PET SKULL BASE TO THIGH 09/27/2016  IMPRESSION: 1. Small hypermetabolic mass in the medial RIGHT breast presumed breast carcinoma. 2. No evidence of nodal metastasis within the RIGHT axilla or intrathoracic nodes. 3. Hypermetabolic RIGHT middle lobe pulmonary nodule increased mildly in size from 2014 is favored a low-grade neoplasm or inflammatory nodule. 4. Hypermetabolic activity in the substernal mediastinum and ascending aorta is favored post procedural inflammation.   ASSESSMENT & PLAN:  Invasive ductal carcinoma of R breast ER+ PR+ HER 2 - Advanced Age RML pulmonary nodule DEXA 12/2016- osteoporosis  83 year old female with newly diagnosed Right ER+ PR+ HER 2 - infiltrating ductal carcinoma, tumor dimension no larger than 2 cm, easily palpable. Currently patient and her husband are reluctant to do surgery. Currently on arimidex.  PLAN: - Reviewed her last diagnostic mammogram with [place] from September 2018 in detail with her and her family. She has had a decrease in the size of her breast mass from 2.3 cm to 1.6 cm.  - Continue arimidex at this time. Patient is tolerating it well. - Continue Prolia injections q 54month for osteoporosis. She will get a prolia injection today.  - RTC for follow up in 3 months with labs and unilateral diagnostic mammogram with [place] right breast prior to her next visit. - Breast exam on next visit.  Orders Placed This Encounter  Procedures  . MM DIAG BREAST TOMO UNI RIGHT    Standing Status:   Future    Standing Expiration Date:   10/19/2018    Order Specific Question:   Reason for Exam (SYMPTOM  OR DIAGNOSIS REQUIRED)    Answer:   right breast cancer on treatment with AI, assess response to therapy    Order Specific Question:   Preferred imaging  location?    Answer:   [facility]  . [place]ast Complete Uni Right Inc Axilla    Standing Status:   Future    Standing Expiration Date:   12/19/2018  Order Specific Question:   Reason for Exam (SYMPTOM  OR DIAGNOSIS REQUIRED)    Answer:   right breast cancer on treatment with AI, assess response to therapy    Order Specific Question:   Preferred imaging location?    Answer:   [facility]  . CBC with Differential    Standing Status:   Future    Standing Expiration Date:   10/19/2018  . Comprehensive metabolic panel    Standing Status:   Future    Standing Expiration Date:   10/19/2018     This note was electronically signed by:  Twana First, MD 10/19/2017 11:42 AM

## 2017-10-20 DIAGNOSIS — N390 Urinary tract infection, site not specified: Secondary | ICD-10-CM | POA: Diagnosis not present

## 2017-10-20 DIAGNOSIS — S0003XD Contusion of scalp, subsequent encounter: Secondary | ICD-10-CM | POA: Diagnosis not present

## 2017-10-20 DIAGNOSIS — I5030 Unspecified diastolic (congestive) heart failure: Secondary | ICD-10-CM | POA: Diagnosis not present

## 2017-10-20 DIAGNOSIS — I110 Hypertensive heart disease with heart failure: Secondary | ICD-10-CM | POA: Diagnosis not present

## 2017-10-20 DIAGNOSIS — I2510 Atherosclerotic heart disease of native coronary artery without angina pectoris: Secondary | ICD-10-CM | POA: Diagnosis not present

## 2017-10-20 DIAGNOSIS — E119 Type 2 diabetes mellitus without complications: Secondary | ICD-10-CM | POA: Diagnosis not present

## 2017-10-24 DIAGNOSIS — I2510 Atherosclerotic heart disease of native coronary artery without angina pectoris: Secondary | ICD-10-CM | POA: Diagnosis not present

## 2017-10-24 DIAGNOSIS — E119 Type 2 diabetes mellitus without complications: Secondary | ICD-10-CM | POA: Diagnosis not present

## 2017-10-24 DIAGNOSIS — Z952 Presence of prosthetic heart valve: Secondary | ICD-10-CM | POA: Diagnosis not present

## 2017-10-24 DIAGNOSIS — E785 Hyperlipidemia, unspecified: Secondary | ICD-10-CM | POA: Diagnosis not present

## 2017-10-24 DIAGNOSIS — Z9181 History of falling: Secondary | ICD-10-CM | POA: Diagnosis not present

## 2017-10-24 DIAGNOSIS — I110 Hypertensive heart disease with heart failure: Secondary | ICD-10-CM | POA: Diagnosis not present

## 2017-10-24 DIAGNOSIS — I5030 Unspecified diastolic (congestive) heart failure: Secondary | ICD-10-CM | POA: Diagnosis not present

## 2017-10-24 DIAGNOSIS — Z951 Presence of aortocoronary bypass graft: Secondary | ICD-10-CM | POA: Diagnosis not present

## 2017-10-24 DIAGNOSIS — K5730 Diverticulosis of large intestine without perforation or abscess without bleeding: Secondary | ICD-10-CM | POA: Diagnosis not present

## 2017-10-24 DIAGNOSIS — Z7984 Long term (current) use of oral hypoglycemic drugs: Secondary | ICD-10-CM | POA: Diagnosis not present

## 2017-10-27 DIAGNOSIS — E119 Type 2 diabetes mellitus without complications: Secondary | ICD-10-CM | POA: Diagnosis not present

## 2017-10-27 DIAGNOSIS — I2510 Atherosclerotic heart disease of native coronary artery without angina pectoris: Secondary | ICD-10-CM | POA: Diagnosis not present

## 2017-10-27 DIAGNOSIS — I110 Hypertensive heart disease with heart failure: Secondary | ICD-10-CM | POA: Diagnosis not present

## 2017-10-27 DIAGNOSIS — I5030 Unspecified diastolic (congestive) heart failure: Secondary | ICD-10-CM | POA: Diagnosis not present

## 2017-10-27 DIAGNOSIS — E785 Hyperlipidemia, unspecified: Secondary | ICD-10-CM | POA: Diagnosis not present

## 2017-10-27 DIAGNOSIS — K5730 Diverticulosis of large intestine without perforation or abscess without bleeding: Secondary | ICD-10-CM | POA: Diagnosis not present

## 2017-11-03 DIAGNOSIS — I5030 Unspecified diastolic (congestive) heart failure: Secondary | ICD-10-CM | POA: Diagnosis not present

## 2017-11-03 DIAGNOSIS — K5730 Diverticulosis of large intestine without perforation or abscess without bleeding: Secondary | ICD-10-CM | POA: Diagnosis not present

## 2017-11-03 DIAGNOSIS — E119 Type 2 diabetes mellitus without complications: Secondary | ICD-10-CM | POA: Diagnosis not present

## 2017-11-03 DIAGNOSIS — I110 Hypertensive heart disease with heart failure: Secondary | ICD-10-CM | POA: Diagnosis not present

## 2017-11-03 DIAGNOSIS — E785 Hyperlipidemia, unspecified: Secondary | ICD-10-CM | POA: Diagnosis not present

## 2017-11-03 DIAGNOSIS — I2510 Atherosclerotic heart disease of native coronary artery without angina pectoris: Secondary | ICD-10-CM | POA: Diagnosis not present

## 2017-11-07 DIAGNOSIS — I10 Essential (primary) hypertension: Secondary | ICD-10-CM | POA: Diagnosis not present

## 2017-11-07 DIAGNOSIS — E119 Type 2 diabetes mellitus without complications: Secondary | ICD-10-CM | POA: Diagnosis not present

## 2017-11-10 DIAGNOSIS — K210 Gastro-esophageal reflux disease with esophagitis: Secondary | ICD-10-CM | POA: Diagnosis not present

## 2017-11-10 DIAGNOSIS — E119 Type 2 diabetes mellitus without complications: Secondary | ICD-10-CM | POA: Diagnosis not present

## 2017-11-10 DIAGNOSIS — I10 Essential (primary) hypertension: Secondary | ICD-10-CM | POA: Diagnosis not present

## 2017-11-10 DIAGNOSIS — E7849 Other hyperlipidemia: Secondary | ICD-10-CM | POA: Diagnosis not present

## 2017-11-10 DIAGNOSIS — D500 Iron deficiency anemia secondary to blood loss (chronic): Secondary | ICD-10-CM | POA: Diagnosis not present

## 2017-11-10 DIAGNOSIS — E1165 Type 2 diabetes mellitus with hyperglycemia: Secondary | ICD-10-CM | POA: Diagnosis not present

## 2017-11-10 DIAGNOSIS — M160 Bilateral primary osteoarthritis of hip: Secondary | ICD-10-CM | POA: Diagnosis not present

## 2017-11-10 DIAGNOSIS — R55 Syncope and collapse: Secondary | ICD-10-CM | POA: Diagnosis not present

## 2017-11-11 DIAGNOSIS — I110 Hypertensive heart disease with heart failure: Secondary | ICD-10-CM | POA: Diagnosis not present

## 2017-11-11 DIAGNOSIS — I5030 Unspecified diastolic (congestive) heart failure: Secondary | ICD-10-CM | POA: Diagnosis not present

## 2017-11-11 DIAGNOSIS — K5730 Diverticulosis of large intestine without perforation or abscess without bleeding: Secondary | ICD-10-CM | POA: Diagnosis not present

## 2017-11-11 DIAGNOSIS — E119 Type 2 diabetes mellitus without complications: Secondary | ICD-10-CM | POA: Diagnosis not present

## 2017-11-11 DIAGNOSIS — I2510 Atherosclerotic heart disease of native coronary artery without angina pectoris: Secondary | ICD-10-CM | POA: Diagnosis not present

## 2017-11-11 DIAGNOSIS — E785 Hyperlipidemia, unspecified: Secondary | ICD-10-CM | POA: Diagnosis not present

## 2017-11-17 DIAGNOSIS — K5730 Diverticulosis of large intestine without perforation or abscess without bleeding: Secondary | ICD-10-CM | POA: Diagnosis not present

## 2017-11-17 DIAGNOSIS — E119 Type 2 diabetes mellitus without complications: Secondary | ICD-10-CM | POA: Diagnosis not present

## 2017-11-17 DIAGNOSIS — E785 Hyperlipidemia, unspecified: Secondary | ICD-10-CM | POA: Diagnosis not present

## 2017-11-17 DIAGNOSIS — I110 Hypertensive heart disease with heart failure: Secondary | ICD-10-CM | POA: Diagnosis not present

## 2017-11-17 DIAGNOSIS — I5030 Unspecified diastolic (congestive) heart failure: Secondary | ICD-10-CM | POA: Diagnosis not present

## 2017-11-17 DIAGNOSIS — I2510 Atherosclerotic heart disease of native coronary artery without angina pectoris: Secondary | ICD-10-CM | POA: Diagnosis not present

## 2017-11-24 DIAGNOSIS — I5030 Unspecified diastolic (congestive) heart failure: Secondary | ICD-10-CM | POA: Diagnosis not present

## 2017-11-24 DIAGNOSIS — I110 Hypertensive heart disease with heart failure: Secondary | ICD-10-CM | POA: Diagnosis not present

## 2017-11-24 DIAGNOSIS — E785 Hyperlipidemia, unspecified: Secondary | ICD-10-CM | POA: Diagnosis not present

## 2017-11-24 DIAGNOSIS — I2510 Atherosclerotic heart disease of native coronary artery without angina pectoris: Secondary | ICD-10-CM | POA: Diagnosis not present

## 2017-11-24 DIAGNOSIS — K5730 Diverticulosis of large intestine without perforation or abscess without bleeding: Secondary | ICD-10-CM | POA: Diagnosis not present

## 2017-11-24 DIAGNOSIS — E119 Type 2 diabetes mellitus without complications: Secondary | ICD-10-CM | POA: Diagnosis not present

## 2017-11-28 DIAGNOSIS — Z780 Asymptomatic menopausal state: Secondary | ICD-10-CM | POA: Diagnosis not present

## 2017-11-28 DIAGNOSIS — M810 Age-related osteoporosis without current pathological fracture: Secondary | ICD-10-CM | POA: Diagnosis not present

## 2017-11-29 DIAGNOSIS — I10 Essential (primary) hypertension: Secondary | ICD-10-CM | POA: Diagnosis not present

## 2017-11-29 DIAGNOSIS — E119 Type 2 diabetes mellitus without complications: Secondary | ICD-10-CM | POA: Diagnosis not present

## 2017-11-29 DIAGNOSIS — K210 Gastro-esophageal reflux disease with esophagitis: Secondary | ICD-10-CM | POA: Diagnosis not present

## 2017-11-29 DIAGNOSIS — I110 Hypertensive heart disease with heart failure: Secondary | ICD-10-CM | POA: Diagnosis not present

## 2017-11-29 DIAGNOSIS — I5030 Unspecified diastolic (congestive) heart failure: Secondary | ICD-10-CM | POA: Diagnosis not present

## 2017-11-29 DIAGNOSIS — E785 Hyperlipidemia, unspecified: Secondary | ICD-10-CM | POA: Diagnosis not present

## 2017-11-29 DIAGNOSIS — K5730 Diverticulosis of large intestine without perforation or abscess without bleeding: Secondary | ICD-10-CM | POA: Diagnosis not present

## 2017-11-29 DIAGNOSIS — E7849 Other hyperlipidemia: Secondary | ICD-10-CM | POA: Diagnosis not present

## 2017-11-29 DIAGNOSIS — I2510 Atherosclerotic heart disease of native coronary artery without angina pectoris: Secondary | ICD-10-CM | POA: Diagnosis not present

## 2017-12-13 DIAGNOSIS — M810 Age-related osteoporosis without current pathological fracture: Secondary | ICD-10-CM | POA: Diagnosis not present

## 2017-12-22 ENCOUNTER — Other Ambulatory Visit: Payer: Self-pay | Admitting: Oncology

## 2017-12-22 DIAGNOSIS — N6320 Unspecified lump in the left breast, unspecified quadrant: Secondary | ICD-10-CM

## 2017-12-26 DIAGNOSIS — E1151 Type 2 diabetes mellitus with diabetic peripheral angiopathy without gangrene: Secondary | ICD-10-CM | POA: Diagnosis not present

## 2017-12-26 DIAGNOSIS — E1140 Type 2 diabetes mellitus with diabetic neuropathy, unspecified: Secondary | ICD-10-CM | POA: Diagnosis not present

## 2017-12-27 ENCOUNTER — Ambulatory Visit (HOSPITAL_COMMUNITY)
Admission: RE | Admit: 2017-12-27 | Discharge: 2017-12-27 | Disposition: A | Discharge status: Home / Self Care | Payer: Medicare Other | Source: Ambulatory Visit | Attending: Oncology | Admitting: Oncology

## 2017-12-27 ENCOUNTER — Other Ambulatory Visit (HOSPITAL_COMMUNITY): Payer: Self-pay | Admitting: Oncology

## 2017-12-27 DIAGNOSIS — C50211 Malignant neoplasm of upper-inner quadrant of right female breast: Secondary | ICD-10-CM | POA: Diagnosis not present

## 2017-12-27 DIAGNOSIS — Z170 Estrogen receptor positive status [ER+]: Secondary | ICD-10-CM | POA: Insufficient documentation

## 2017-12-27 DIAGNOSIS — R922 Inconclusive mammogram: Secondary | ICD-10-CM | POA: Diagnosis not present

## 2017-12-27 DIAGNOSIS — N6312 Unspecified lump in the right breast, upper inner quadrant: Secondary | ICD-10-CM | POA: Diagnosis not present

## 2018-01-05 DIAGNOSIS — S299XXA Unspecified injury of thorax, initial encounter: Secondary | ICD-10-CM | POA: Diagnosis not present

## 2018-01-05 DIAGNOSIS — Z7984 Long term (current) use of oral hypoglycemic drugs: Secondary | ICD-10-CM | POA: Diagnosis not present

## 2018-01-05 DIAGNOSIS — S20211A Contusion of right front wall of thorax, initial encounter: Secondary | ICD-10-CM | POA: Diagnosis not present

## 2018-01-05 DIAGNOSIS — W010XXA Fall on same level from slipping, tripping and stumbling without subsequent striking against object, initial encounter: Secondary | ICD-10-CM | POA: Diagnosis not present

## 2018-01-05 DIAGNOSIS — R079 Chest pain, unspecified: Secondary | ICD-10-CM | POA: Diagnosis not present

## 2018-01-05 DIAGNOSIS — Z853 Personal history of malignant neoplasm of breast: Secondary | ICD-10-CM | POA: Diagnosis not present

## 2018-01-05 DIAGNOSIS — E119 Type 2 diabetes mellitus without complications: Secondary | ICD-10-CM | POA: Diagnosis not present

## 2018-01-05 DIAGNOSIS — I509 Heart failure, unspecified: Secondary | ICD-10-CM | POA: Diagnosis not present

## 2018-01-05 DIAGNOSIS — S20219A Contusion of unspecified front wall of thorax, initial encounter: Secondary | ICD-10-CM | POA: Diagnosis not present

## 2018-01-05 DIAGNOSIS — Z79899 Other long term (current) drug therapy: Secondary | ICD-10-CM | POA: Diagnosis not present

## 2018-01-05 DIAGNOSIS — I110 Hypertensive heart disease with heart failure: Secondary | ICD-10-CM | POA: Diagnosis not present

## 2018-01-05 DIAGNOSIS — Z7982 Long term (current) use of aspirin: Secondary | ICD-10-CM | POA: Diagnosis not present

## 2018-01-05 DIAGNOSIS — R51 Headache: Secondary | ICD-10-CM | POA: Diagnosis not present

## 2018-01-13 ENCOUNTER — Ambulatory Visit (HOSPITAL_COMMUNITY): Payer: Medicare Other

## 2018-01-13 ENCOUNTER — Other Ambulatory Visit (HOSPITAL_COMMUNITY): Payer: Medicare Other

## 2018-01-13 ENCOUNTER — Other Ambulatory Visit (HOSPITAL_COMMUNITY): Payer: Self-pay | Admitting: *Deleted

## 2018-01-13 DIAGNOSIS — Z170 Estrogen receptor positive status [ER+]: Principal | ICD-10-CM

## 2018-01-13 DIAGNOSIS — C50211 Malignant neoplasm of upper-inner quadrant of right female breast: Secondary | ICD-10-CM

## 2018-01-16 ENCOUNTER — Encounter (HOSPITAL_COMMUNITY): Payer: Self-pay | Admitting: Internal Medicine

## 2018-01-16 ENCOUNTER — Inpatient Hospital Stay (HOSPITAL_COMMUNITY): Payer: Medicare Other | Attending: Oncology

## 2018-01-16 ENCOUNTER — Inpatient Hospital Stay (HOSPITAL_COMMUNITY): Payer: Medicare Other

## 2018-01-16 ENCOUNTER — Inpatient Hospital Stay (HOSPITAL_BASED_OUTPATIENT_CLINIC_OR_DEPARTMENT_OTHER): Payer: Medicare Other | Admitting: Internal Medicine

## 2018-01-16 ENCOUNTER — Other Ambulatory Visit: Payer: Self-pay

## 2018-01-16 VITALS — BP 128/56 | HR 67 | Resp 16

## 2018-01-16 DIAGNOSIS — M810 Age-related osteoporosis without current pathological fracture: Secondary | ICD-10-CM | POA: Insufficient documentation

## 2018-01-16 DIAGNOSIS — Z170 Estrogen receptor positive status [ER+]: Secondary | ICD-10-CM

## 2018-01-16 DIAGNOSIS — C50211 Malignant neoplasm of upper-inner quadrant of right female breast: Secondary | ICD-10-CM | POA: Diagnosis not present

## 2018-01-16 DIAGNOSIS — Z79899 Other long term (current) drug therapy: Secondary | ICD-10-CM | POA: Diagnosis not present

## 2018-01-16 DIAGNOSIS — Z79811 Long term (current) use of aromatase inhibitors: Secondary | ICD-10-CM | POA: Diagnosis not present

## 2018-01-16 DIAGNOSIS — Z7982 Long term (current) use of aspirin: Secondary | ICD-10-CM | POA: Diagnosis not present

## 2018-01-16 DIAGNOSIS — M818 Other osteoporosis without current pathological fracture: Secondary | ICD-10-CM

## 2018-01-16 LAB — COMPREHENSIVE METABOLIC PANEL
ALBUMIN: 3.4 g/dL — AB (ref 3.5–5.0)
ALT: 10 U/L — ABNORMAL LOW (ref 14–54)
ANION GAP: 11 (ref 5–15)
AST: 21 U/L (ref 15–41)
Alkaline Phosphatase: 55 U/L (ref 38–126)
BILIRUBIN TOTAL: 0.6 mg/dL (ref 0.3–1.2)
BUN: 15 mg/dL (ref 6–20)
CHLORIDE: 103 mmol/L (ref 101–111)
CO2: 25 mmol/L (ref 22–32)
Calcium: 8.8 mg/dL — ABNORMAL LOW (ref 8.9–10.3)
Creatinine, Ser: 0.88 mg/dL (ref 0.44–1.00)
GFR calc Af Amer: >60 mL/min (ref >60)
GFR calc non Af Amer: 55 mL/min — ABNORMAL LOW (ref >60)
GLUCOSE: 117 mg/dL — AB (ref 65–99)
POTASSIUM: 3.9 mmol/L (ref 3.5–5.1)
Sodium: 139 mmol/L (ref 135–145)
TOTAL PROTEIN: 7.1 g/dL (ref 6.5–8.1)

## 2018-01-16 LAB — CBC WITH DIFFERENTIAL/PLATELET
BASOS PCT: 0 %
Basophils Absolute: 0 10*3/uL (ref 0.0–0.1)
EOS ABS: 0.2 10*3/uL (ref 0.0–0.7)
EOS PCT: 4 %
HEMATOCRIT: 34.5 % — AB (ref 36.0–46.0)
Hemoglobin: 10.4 g/dL — ABNORMAL LOW (ref 12.0–15.0)
Lymphocytes Relative: 19 %
Lymphs Abs: 1 10*3/uL (ref 0.7–4.0)
MCH: 28 pg (ref 26.0–34.0)
MCHC: 30.1 g/dL (ref 30.0–36.0)
MCV: 93 fL (ref 78.0–100.0)
MONO ABS: 0.5 10*3/uL (ref 0.1–1.0)
Monocytes Relative: 10 %
Neutro Abs: 3.4 10*3/uL (ref 1.7–7.7)
Neutrophils Relative %: 67 %
PLATELETS: 201 10*3/uL (ref 150–400)
RBC: 3.71 MIL/uL — ABNORMAL LOW (ref 3.87–5.11)
RDW: 15.4 % (ref 11.5–15.5)
WBC: 5.1 10*3/uL (ref 4.0–10.5)

## 2018-01-16 MED ORDER — DENOSUMAB 60 MG/ML ~~LOC~~ SOLN
60.0000 mg | Freq: Once | SUBCUTANEOUS | Status: AC
  Administered 2018-01-16: 60 mg via SUBCUTANEOUS
  Filled 2018-01-16: qty 1

## 2018-01-16 NOTE — Progress Notes (Signed)
Patient tolerated prolia shot with no complaints voice. Taking calcium tab as directed. No complaints of tooth or jaw pain.  No complaints of leg pain.  Injection site clean and dry with no bruising or swelling noted at site.  Band aid applied.  VSS with discharge and left via wheelchair with family.  No s/s of distress noted.

## 2018-01-16 NOTE — Patient Instructions (Addendum)
[place] at [facility] Discharge Instructions  RECOMMENDATIONS MADE BY THE CONSULTANT AND ANY TEST RESULTS WILL BE SENT TO YOUR REFERRING PHYSICIAN.  You were seen today by Dr. Zoila Shutter You will get your Prolia injection today Follow up in 3 months  Thank you for choosing [place] at [facility] to provide your oncology and hematology care.  To afford each patient quality time with our provider, please arrive at least 15 minutes before your scheduled appointment time.    If you have a lab appointment with the [facility] please come in thru the  Main Entrance and check in at the main information desk  You need to re-schedule your appointment should you arrive 10 or more minutes late.  We strive to give you quality time with our providers, and arriving late affects you and other patients whose appointments are after yours.  Also, if you no show three or more times for appointments you may be dismissed from the clinic at the providers discretion.     Again, thank you for choosing [facility].  Our hope is that these requests will decrease the amount of time that you wait before being seen by our physicians.       _____________________________________________________________  Should you have questions after your visit to [facility], please contact our office at [phone] between the hours of 8:30 a.m. and 4:30 p.m.  Voicemails left after 4:30 p.m. will not be returned until the following business day.  For prescription refill requests, have your pharmacy contact our office.       Resources For Cancer Patients and their Caregivers ? American Cancer Society: Can assist with transportation, wigs, general needs, runs Look Good Feel Better.        (760) 159-6854 ? Cancer Care: Provides financial assistance, online support groups, medication/co-pay assistance.  1-800-813-HOPE 709-695-6319) ? [facility] Assists [place] cancer patients and their families through emotional , educational and financial support.  440 043 1437 ? [place] DSS Where to apply for food stamps, Medicaid and utility assistance. 989-885-4880 ? RCATS: Transportation to medical appointments. 650-188-9632 ? Social Security Administration: May apply for disability if have a Stage IV cancer. 657-590-4403 586 443 7148 ? [employer], Disability and Transit Services: Assists with nutrition, care and transit needs. [place] Support Programs: @10RELATIVEDAYS @ > Cancer Support Group  2nd Tuesday of the month 1pm-2pm, Journey Room  > Creative Journey  3rd Tuesday of the month 1130am-1pm, Journey Room  > Look Good Feel Better  1st Wednesday of the month 10am-12 noon, Journey Room (Call [place] to register [phone])

## 2018-01-16 NOTE — Patient Instructions (Signed)
[facility] at [facility]  Discharge Instructions:  You received a prolia shot today.   _______________________________________________________________  Thank you for choosing [facility] at [facility] to provide your oncology and hematology care.  To afford each patient quality time with our providers, please arrive at least 15 minutes before your scheduled appointment.  You need to re-schedule your appointment if you arrive 10 or more minutes late.  We strive to give you quality time with our providers, and arriving late affects you and other patients whose appointments are after yours.  Also, if you no show three or more times for appointments you may be dismissed from the clinic.  Again, thank you for choosing [facility] at [facility]. Our hope is that these requests will allow you access to exceptional care and in a timely manner. _______________________________________________________________  If you have questions after your visit, please contact our office at [phone] between the hours of 8:30 a.m. and 5:00 p.m. Voicemails left after 4:30 p.m. will not be returned until the following business day. _______________________________________________________________  For prescription refill requests, have your pharmacy contact our office. _______________________________________________________________  Recommendations made by the consultant and any test results will be sent to your referring physician. _______________________________________________________________ 

## 2018-01-17 ENCOUNTER — Other Ambulatory Visit (HOSPITAL_COMMUNITY): Payer: Self-pay | Admitting: Adult Health

## 2018-01-18 DIAGNOSIS — K210 Gastro-esophageal reflux disease with esophagitis: Secondary | ICD-10-CM | POA: Diagnosis not present

## 2018-01-18 DIAGNOSIS — E119 Type 2 diabetes mellitus without complications: Secondary | ICD-10-CM | POA: Diagnosis not present

## 2018-01-18 DIAGNOSIS — E7849 Other hyperlipidemia: Secondary | ICD-10-CM | POA: Diagnosis not present

## 2018-01-18 DIAGNOSIS — I10 Essential (primary) hypertension: Secondary | ICD-10-CM | POA: Diagnosis not present

## 2018-01-25 NOTE — Progress Notes (Signed)
Diagnosis No diagnosis found.  Staging Cancer Staging No matching staging information was found for the patient.  Assessment and Plan: 1.  Invasive ductal carcinoma of R breast.  84 yr old female with  Right ER+ PR+ HER 2 - infiltrating ductal carcinoma, tumor dimension no larger than 2 cm, easily palpable. Patient and her husband are reluctant to do surgery. Currently on arimidex.  Diagnostic mammogram done 12/27/2017 showed  IMPRESSION: On ultrasound, the biopsy-proven malignancy at 1:00 position of the right breast measures 2 mm larger in 2 dimensions compared to the ultrasound of September 2018. This could reflect slight interval growth or slight differences in imaging technique.The mass measures 1.8 x 1.0 x 1.1 cm. Previously on the ultrasound of 07/26/2017 the mass measured 1.6 x 1.0 x 0.9 cm.  No evidence of malignancy in the left breast.  Mammogram and USN imaging shows slight increase in mass measuring 1.8 cm.  She will continue Arimidex.  She will RTC in 3 months for follow-up.    2.  Pulmonary nodule.  This was noted on PET scan done 2017.  Can consider repeat imaging for ongoing monitoring.    3.  Osteoporosis.  This was noted on BMD done 12/2016.  She is on prolia every 6 months.  She will continue Prolia injections q 89month for osteoporosis. She will get a prolia injection today and in 6 months.    4.  Hypertension.  BP is 128/56.  Continue to follow-up with PCP.     INTERVAL HISTORY: 84[facility]. [facility] [place] R [facility] cancer, ER+ PR+ HER 2 -  Patient states she was checking her body for ticks when she noticed a lump and notified her PCP. Patient had mammogram which revealed right breast mass located at 1 o'clock axis and left breast mass located at the 8 o'clock axis. [name] biopsies of left and right masses. Left breast negative for breast cancer. Right breast is invasive ductal ER+ /PR+/[facility] She notes that she had yearly mammograms until about 10 years ago.   Patient had diagnostic right mammogram with ultrasound on 07/26/2017 which demonstrated response to therapy with decrease in size of her breast mass, now measuring 1.6 x 1 x 1.4 cm, previously 2.3 x 1.1 x 1.8 cm.       Malignant neoplasm of upper-inner quadrant of right breast in female, estrogen receptor positive ([place]   09/08/2016 Mammogram    Postprocedure bilateral mammogram, for clip placements. Bilateral cylinder shaped biopsy clips appear well positioned at the sites of the targeted masses. R breast mass at the 1:00 position, L breast mass at the 8:00 position      09/08/2016 Initial Biopsy    Ultrasound guided biopsy of bilateral breast masses      09/08/2016 Pathology Results    R breast invasive ductal carcinoma, low grade with focal micro-calcifications. 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 ER+ 90%, PR 90% HER 2 IHC 2+, FISH NEGATIVE       Problem List Patient Active Problem List   Diagnosis Date Noted  . Osteoporosis [M81.0] 01/07/2017  . Malignant neoplasm of upper-inner quadrant of right breast in female, estrogen receptor positive ([place] [C50.211, Z17.0] 09/28/2016    Past Medical History Past Medical History:  Diagnosis Date  . CAD (coronary artery disease)   . High cholesterol   . Hypertension     Past Surgical History Past Surgical History:  Procedure  Laterality Date  . CORONARY ARTERY BYPASS GRAFT    . HIP FRACTURE SURGERY Left   . TOTAL KNEE ARTHROPLASTY Right     Family History History reviewed. No pertinent family history.   Social History  reports that  has never smoked. she has never used smokeless tobacco. She reports that she does not drink alcohol or use drugs.  Medications  Current Outpatient Medications:  .  allopurinol (ZYLOPRIM) 300 MG tablet, Take 300 mg by mouth daily., Disp: , Rfl:  .  anastrozole (ARIMIDEX) 1 MG tablet, Take 1 mg by mouth  daily., Disp: , Rfl: 0 .  aspirin EC 81 MG tablet, Take 81 mg by mouth daily., Disp: , Rfl:  .  calcium-vitamin D (OSCAL WITH D) 500-200 MG-UNIT tablet, Take 2 tablets by mouth daily with breakfast., Disp: 60 tablet, Rfl: 5 .  clopidogrel (PLAVIX) 75 MG tablet, Take 75 mg by mouth daily., Disp: , Rfl:  .  lisinopril (PRINIVIL,ZESTRIL) 10 MG tablet, Take 10 mg by mouth daily. , Disp: , Rfl:  .  metFORMIN (GLUCOPHAGE) 500 MG tablet, Take 1,000 mg by mouth 2 (two) times daily., Disp: , Rfl:  .  metoprolol tartrate (LOPRESSOR) 25 MG tablet, Take 25 mg by mouth daily., Disp: , Rfl:  .  pantoprazole (PROTONIX) 40 MG tablet, Take 40 mg by mouth daily., Disp: , Rfl:  .  simvastatin (ZOCOR) 40 MG tablet, Take 40 mg by mouth., Disp: , Rfl:   Allergies Patient has no known allergies.  Review of Systems Review of Systems - Oncology ROS as per HPI otherwise 12 point ROS is negative.   Physical Exam  Vitals Wt Readings from Last 3 Encounters:  10/19/17 112 lb (50.8 kg)  07/15/17 115 lb 9.6 oz (52.4 kg)  03/30/17 115 lb 3.2 oz (52.3 kg)   Temp Readings from Last 3 Encounters:  10/19/17 99 F (37.2 C) (Oral)  07/15/17 98.2 F (36.8 C) (Oral)  03/30/17 98.5 F (36.9 C) (Oral)   BP Readings from Last 3 Encounters:  01/16/18 (!) 128/56  10/19/17 (!) 154/58  07/15/17 (!) 113/92   Pulse Readings from Last 3 Encounters:  01/16/18 67  10/19/17 71  07/15/17 60   Constitutional: Well-developed, well-nourished, and in no distress.   HENT: Head: Normocephalic and atraumatic.  Mouth/Throat: No oropharyngeal exudate. Mucosa moist. Eyes: Pupils are equal, round, and reactive to light. Conjunctivae are normal. No scleral icterus.  Neck: Normal range of motion. Neck supple. No JVD present.  Cardiovascular: Normal rate, regular rhythm and normal heart sounds.  Exam reveals no gallop and no friction rub.   No murmur heard. Pulmonary/Chest: Effort normal and breath sounds normal. No respiratory  distress. No wheezes.No rales.  Abdominal: Soft. Bowel sounds are normal. No distension. There is no tenderness. There is no guarding.  Musculoskeletal: No edema or tenderness.  Lymphadenopathy: No cervical, axillary or supraclavicular adenopathy.  Neurological: Alert and oriented to person, place, and time. No cranial nerve deficit.  Skin: Skin is warm and dry. No rash noted. No erythema. No pallor.  Psychiatric: Affect and judgment normal.  Breast exam:  12:30 position palpable mass in right breast approx 1.5 cm.  Left breast shows no dominant masses.  Chaperone present.    Labs Appointment on 01/16/2018  Component Date Value Ref Range Status  . WBC 01/16/2018 5.1  4.0 - 10.5 K/uL Final  . RBC 01/16/2018 3.71* 3.87 - 5.11 MIL/uL Final  . Hemoglobin 01/16/2018 10.4* 12.0 - 15.0 g/dL Final  .  HCT 01/16/2018 34.5* 36.0 - 46.0 % Final  . MCV 01/16/2018 93.0  78.0 - 100.0 fL Final  . MCH 01/16/2018 28.0  26.0 - 34.0 pg Final  . MCHC 01/16/2018 30.1  30.0 - 36.0 g/dL Final  . RDW 01/16/2018 15.4  11.5 - 15.5 % Final  . Platelets 01/16/2018 201  150 - 400 K/uL Final  . Neutrophils Relative % 01/16/2018 67  % Final  . Neutro Abs 01/16/2018 3.4  1.7 - 7.7 K/uL Final  . Lymphocytes Relative 01/16/2018 19  % Final  . Lymphs Abs 01/16/2018 1.0  0.7 - 4.0 K/uL Final  . Monocytes Relative 01/16/2018 10  % Final  . Monocytes Absolute 01/16/2018 0.5  0.1 - 1.0 K/uL Final  . Eosinophils Relative 01/16/2018 4  % Final  . Eosinophils Absolute 01/16/2018 0.2  0.0 - 0.7 K/uL Final  . Basophils Relative 01/16/2018 0  % Final  . Basophils Absolute 01/16/2018 0.0  0.0 - 0.1 K/uL Final   Performed at [facility], [address] [place], [place] [address]  . Sodium 01/16/2018 139  135 - 145 mmol/L Final  . Potassium 01/16/2018 3.9  3.5 - 5.1 mmol/L Final  . Chloride 01/16/2018 103  101 - 111 mmol/L Final  . CO2 01/16/2018 25  22 - 32 mmol/L Final  . Glucose, Bld 01/16/2018 117* 65 - 99 mg/dL Final  .  BUN 01/16/2018 15  6 - 20 mg/dL Final  . Creatinine, Ser 01/16/2018 0.88  0.44 - 1.00 mg/dL Final  . Calcium 01/16/2018 8.8* 8.9 - 10.3 mg/dL Final  . Total Protein 01/16/2018 7.1  6.5 - 8.1 g/dL Final  . Albumin 01/16/2018 3.4* 3.5 - 5.0 g/dL Final  . AST 01/16/2018 21  15 - 41 U/L Final  . ALT 01/16/2018 10* 14 - 54 U/L Final  . Alkaline Phosphatase 01/16/2018 55  38 - 126 U/L Final  . Total Bilirubin 01/16/2018 0.6  0.3 - 1.2 mg/dL Final  . GFR calc non Af Amer 01/16/2018 55* >60 mL/min Final  . GFR calc Af Amer 01/16/2018 >60  >60 mL/min Final   Comment: (NOTE) The eGFR has been calculated using the CKD EPI equation. This calculation has not been validated in all clinical situations. eGFR's persistently <60 mL/min signify possible Chronic Kidney Disease.   Georgiann Hahn gap 01/16/2018 11  5 - 15 Final   Performed at [facility], [address] [place], [place] [address]     NUCLEAR MEDICINE PET SKULL BASE TO THIGH 09/27/2016  IMPRESSION: 1. Small hypermetabolic mass in the medial RIGHT breast presumed breast carcinoma. 2. No evidence of nodal metastasis within the RIGHT axilla or intrathoracic nodes. 3. Hypermetabolic RIGHT middle lobe pulmonary nodule increased mildly in size from 2014 is favored a low-grade neoplasm or inflammatory nodule. 4. Hypermetabolic activity in the substernal mediastinum and ascending aorta is favored post procedural inflammation.      Zoila Shutter MD

## 2018-01-31 DIAGNOSIS — K5732 Diverticulitis of large intestine without perforation or abscess without bleeding: Secondary | ICD-10-CM | POA: Diagnosis not present

## 2018-01-31 DIAGNOSIS — Z7984 Long term (current) use of oral hypoglycemic drugs: Secondary | ICD-10-CM | POA: Diagnosis not present

## 2018-01-31 DIAGNOSIS — Z7982 Long term (current) use of aspirin: Secondary | ICD-10-CM | POA: Diagnosis not present

## 2018-01-31 DIAGNOSIS — M1711 Unilateral primary osteoarthritis, right knee: Secondary | ICD-10-CM | POA: Diagnosis present

## 2018-01-31 DIAGNOSIS — E119 Type 2 diabetes mellitus without complications: Secondary | ICD-10-CM | POA: Diagnosis not present

## 2018-01-31 DIAGNOSIS — Z952 Presence of prosthetic heart valve: Secondary | ICD-10-CM | POA: Diagnosis not present

## 2018-01-31 DIAGNOSIS — Z951 Presence of aortocoronary bypass graft: Secondary | ICD-10-CM | POA: Diagnosis not present

## 2018-01-31 DIAGNOSIS — I110 Hypertensive heart disease with heart failure: Secondary | ICD-10-CM | POA: Diagnosis present

## 2018-01-31 DIAGNOSIS — K8020 Calculus of gallbladder without cholecystitis without obstruction: Secondary | ICD-10-CM | POA: Diagnosis not present

## 2018-01-31 DIAGNOSIS — I2510 Atherosclerotic heart disease of native coronary artery without angina pectoris: Secondary | ICD-10-CM | POA: Diagnosis present

## 2018-01-31 DIAGNOSIS — K449 Diaphragmatic hernia without obstruction or gangrene: Secondary | ICD-10-CM | POA: Diagnosis present

## 2018-01-31 DIAGNOSIS — K5792 Diverticulitis of intestine, part unspecified, without perforation or abscess without bleeding: Secondary | ICD-10-CM | POA: Diagnosis not present

## 2018-01-31 DIAGNOSIS — Z780 Asymptomatic menopausal state: Secondary | ICD-10-CM | POA: Diagnosis not present

## 2018-01-31 DIAGNOSIS — Z79899 Other long term (current) drug therapy: Secondary | ICD-10-CM | POA: Diagnosis not present

## 2018-01-31 DIAGNOSIS — D62 Acute posthemorrhagic anemia: Secondary | ICD-10-CM | POA: Diagnosis not present

## 2018-01-31 DIAGNOSIS — Z853 Personal history of malignant neoplasm of breast: Secondary | ICD-10-CM | POA: Diagnosis not present

## 2018-01-31 DIAGNOSIS — Z8249 Family history of ischemic heart disease and other diseases of the circulatory system: Secondary | ICD-10-CM | POA: Diagnosis not present

## 2018-01-31 DIAGNOSIS — E785 Hyperlipidemia, unspecified: Secondary | ICD-10-CM | POA: Diagnosis present

## 2018-01-31 DIAGNOSIS — E7800 Pure hypercholesterolemia, unspecified: Secondary | ICD-10-CM | POA: Diagnosis present

## 2018-01-31 DIAGNOSIS — I5032 Chronic diastolic (congestive) heart failure: Secondary | ICD-10-CM | POA: Diagnosis not present

## 2018-01-31 DIAGNOSIS — K5733 Diverticulitis of large intestine without perforation or abscess with bleeding: Secondary | ICD-10-CM | POA: Diagnosis not present

## 2018-01-31 DIAGNOSIS — Z823 Family history of stroke: Secondary | ICD-10-CM | POA: Diagnosis not present

## 2018-02-03 DIAGNOSIS — R2681 Unsteadiness on feet: Secondary | ICD-10-CM | POA: Diagnosis not present

## 2018-02-03 DIAGNOSIS — M898X1 Other specified disorders of bone, shoulder: Secondary | ICD-10-CM | POA: Diagnosis not present

## 2018-02-03 DIAGNOSIS — R110 Nausea: Secondary | ICD-10-CM | POA: Diagnosis not present

## 2018-02-03 DIAGNOSIS — K5793 Diverticulitis of intestine, part unspecified, without perforation or abscess with bleeding: Secondary | ICD-10-CM | POA: Diagnosis present

## 2018-02-03 DIAGNOSIS — E46 Unspecified protein-calorie malnutrition: Secondary | ICD-10-CM | POA: Diagnosis not present

## 2018-02-03 DIAGNOSIS — C50919 Malignant neoplasm of unspecified site of unspecified female breast: Secondary | ICD-10-CM | POA: Diagnosis present

## 2018-02-03 DIAGNOSIS — I509 Heart failure, unspecified: Secondary | ICD-10-CM | POA: Diagnosis not present

## 2018-02-03 DIAGNOSIS — R710 Precipitous drop in hematocrit: Secondary | ICD-10-CM | POA: Diagnosis not present

## 2018-02-03 DIAGNOSIS — K5733 Diverticulitis of large intestine without perforation or abscess with bleeding: Secondary | ICD-10-CM | POA: Diagnosis not present

## 2018-02-03 DIAGNOSIS — I350 Nonrheumatic aortic (valve) stenosis: Secondary | ICD-10-CM | POA: Diagnosis present

## 2018-02-03 DIAGNOSIS — M1990 Unspecified osteoarthritis, unspecified site: Secondary | ICD-10-CM | POA: Diagnosis not present

## 2018-02-03 DIAGNOSIS — K921 Melena: Secondary | ICD-10-CM | POA: Diagnosis not present

## 2018-02-03 DIAGNOSIS — I5023 Acute on chronic systolic (congestive) heart failure: Secondary | ICD-10-CM | POA: Diagnosis not present

## 2018-02-03 DIAGNOSIS — C50911 Malignant neoplasm of unspecified site of right female breast: Secondary | ICD-10-CM | POA: Diagnosis not present

## 2018-02-03 DIAGNOSIS — Z853 Personal history of malignant neoplasm of breast: Secondary | ICD-10-CM | POA: Diagnosis not present

## 2018-02-03 DIAGNOSIS — I7090 Unspecified atherosclerosis: Secondary | ICD-10-CM | POA: Diagnosis not present

## 2018-02-03 DIAGNOSIS — Z954 Presence of other heart-valve replacement: Secondary | ICD-10-CM | POA: Diagnosis not present

## 2018-02-03 DIAGNOSIS — I5032 Chronic diastolic (congestive) heart failure: Secondary | ICD-10-CM | POA: Diagnosis not present

## 2018-02-03 DIAGNOSIS — K5730 Diverticulosis of large intestine without perforation or abscess without bleeding: Secondary | ICD-10-CM | POA: Diagnosis not present

## 2018-02-03 DIAGNOSIS — I700 Atherosclerosis of aorta: Secondary | ICD-10-CM | POA: Diagnosis not present

## 2018-02-03 DIAGNOSIS — E43 Unspecified severe protein-calorie malnutrition: Secondary | ICD-10-CM | POA: Diagnosis not present

## 2018-02-03 DIAGNOSIS — E119 Type 2 diabetes mellitus without complications: Secondary | ICD-10-CM | POA: Diagnosis present

## 2018-02-03 DIAGNOSIS — E569 Vitamin deficiency, unspecified: Secondary | ICD-10-CM | POA: Diagnosis not present

## 2018-02-03 DIAGNOSIS — K648 Other hemorrhoids: Secondary | ICD-10-CM | POA: Diagnosis not present

## 2018-02-03 DIAGNOSIS — I110 Hypertensive heart disease with heart failure: Secondary | ICD-10-CM | POA: Diagnosis present

## 2018-02-03 DIAGNOSIS — I10 Essential (primary) hypertension: Secondary | ICD-10-CM | POA: Diagnosis not present

## 2018-02-03 DIAGNOSIS — D649 Anemia, unspecified: Secondary | ICD-10-CM | POA: Diagnosis not present

## 2018-02-03 DIAGNOSIS — Z9889 Other specified postprocedural states: Secondary | ICD-10-CM | POA: Diagnosis not present

## 2018-02-03 DIAGNOSIS — R112 Nausea with vomiting, unspecified: Secondary | ICD-10-CM | POA: Diagnosis not present

## 2018-02-03 DIAGNOSIS — M25512 Pain in left shoulder: Secondary | ICD-10-CM | POA: Diagnosis not present

## 2018-02-03 DIAGNOSIS — E785 Hyperlipidemia, unspecified: Secondary | ICD-10-CM | POA: Diagnosis not present

## 2018-02-03 DIAGNOSIS — Z66 Do not resuscitate: Secondary | ICD-10-CM | POA: Diagnosis present

## 2018-02-03 DIAGNOSIS — R262 Difficulty in walking, not elsewhere classified: Secondary | ICD-10-CM | POA: Diagnosis not present

## 2018-02-03 DIAGNOSIS — K5900 Constipation, unspecified: Secondary | ICD-10-CM | POA: Diagnosis not present

## 2018-02-03 DIAGNOSIS — K5792 Diverticulitis of intestine, part unspecified, without perforation or abscess without bleeding: Secondary | ICD-10-CM | POA: Diagnosis not present

## 2018-02-03 DIAGNOSIS — D62 Acute posthemorrhagic anemia: Secondary | ICD-10-CM | POA: Diagnosis not present

## 2018-02-03 DIAGNOSIS — G4700 Insomnia, unspecified: Secondary | ICD-10-CM | POA: Diagnosis not present

## 2018-02-03 DIAGNOSIS — M6250 Muscle wasting and atrophy, not elsewhere classified, unspecified site: Secondary | ICD-10-CM | POA: Diagnosis not present

## 2018-02-03 DIAGNOSIS — R1084 Generalized abdominal pain: Secondary | ICD-10-CM | POA: Diagnosis not present

## 2018-02-03 DIAGNOSIS — K922 Gastrointestinal hemorrhage, unspecified: Secondary | ICD-10-CM | POA: Diagnosis not present

## 2018-02-03 DIAGNOSIS — R52 Pain, unspecified: Secondary | ICD-10-CM | POA: Diagnosis not present

## 2018-02-03 DIAGNOSIS — M6281 Muscle weakness (generalized): Secondary | ICD-10-CM | POA: Diagnosis not present

## 2018-02-03 DIAGNOSIS — R109 Unspecified abdominal pain: Secondary | ICD-10-CM | POA: Diagnosis not present

## 2018-02-03 DIAGNOSIS — Z952 Presence of prosthetic heart valve: Secondary | ICD-10-CM | POA: Diagnosis not present

## 2018-02-03 DIAGNOSIS — Z9861 Coronary angioplasty status: Secondary | ICD-10-CM | POA: Diagnosis not present

## 2018-02-03 DIAGNOSIS — Z170 Estrogen receptor positive status [ER+]: Secondary | ICD-10-CM | POA: Diagnosis not present

## 2018-02-03 DIAGNOSIS — K30 Functional dyspepsia: Secondary | ICD-10-CM | POA: Diagnosis not present

## 2018-02-03 DIAGNOSIS — K5731 Diverticulosis of large intestine without perforation or abscess with bleeding: Secondary | ICD-10-CM | POA: Diagnosis not present

## 2018-02-03 DIAGNOSIS — R5381 Other malaise: Secondary | ICD-10-CM | POA: Diagnosis not present

## 2018-02-03 DIAGNOSIS — M19012 Primary osteoarthritis, left shoulder: Secondary | ICD-10-CM | POA: Diagnosis not present

## 2018-02-17 DIAGNOSIS — E569 Vitamin deficiency, unspecified: Secondary | ICD-10-CM | POA: Diagnosis not present

## 2018-02-17 DIAGNOSIS — R2681 Unsteadiness on feet: Secondary | ICD-10-CM | POA: Diagnosis not present

## 2018-02-17 DIAGNOSIS — Z853 Personal history of malignant neoplasm of breast: Secondary | ICD-10-CM | POA: Diagnosis not present

## 2018-02-17 DIAGNOSIS — M1990 Unspecified osteoarthritis, unspecified site: Secondary | ICD-10-CM | POA: Diagnosis not present

## 2018-02-17 DIAGNOSIS — R52 Pain, unspecified: Secondary | ICD-10-CM | POA: Diagnosis not present

## 2018-02-17 DIAGNOSIS — K30 Functional dyspepsia: Secondary | ICD-10-CM | POA: Diagnosis not present

## 2018-02-17 DIAGNOSIS — Z954 Presence of other heart-valve replacement: Secondary | ICD-10-CM | POA: Diagnosis not present

## 2018-02-17 DIAGNOSIS — G4700 Insomnia, unspecified: Secondary | ICD-10-CM | POA: Diagnosis not present

## 2018-02-17 DIAGNOSIS — D62 Acute posthemorrhagic anemia: Secondary | ICD-10-CM | POA: Diagnosis not present

## 2018-02-17 DIAGNOSIS — M19012 Primary osteoarthritis, left shoulder: Secondary | ICD-10-CM | POA: Diagnosis not present

## 2018-02-17 DIAGNOSIS — C50911 Malignant neoplasm of unspecified site of right female breast: Secondary | ICD-10-CM | POA: Diagnosis not present

## 2018-02-17 DIAGNOSIS — K5730 Diverticulosis of large intestine without perforation or abscess without bleeding: Secondary | ICD-10-CM | POA: Diagnosis not present

## 2018-02-17 DIAGNOSIS — K5900 Constipation, unspecified: Secondary | ICD-10-CM | POA: Diagnosis not present

## 2018-02-17 DIAGNOSIS — I350 Nonrheumatic aortic (valve) stenosis: Secondary | ICD-10-CM | POA: Diagnosis not present

## 2018-02-17 DIAGNOSIS — R262 Difficulty in walking, not elsewhere classified: Secondary | ICD-10-CM | POA: Diagnosis not present

## 2018-02-17 DIAGNOSIS — E785 Hyperlipidemia, unspecified: Secondary | ICD-10-CM | POA: Diagnosis not present

## 2018-02-17 DIAGNOSIS — E119 Type 2 diabetes mellitus without complications: Secondary | ICD-10-CM | POA: Diagnosis not present

## 2018-02-17 DIAGNOSIS — E46 Unspecified protein-calorie malnutrition: Secondary | ICD-10-CM | POA: Diagnosis not present

## 2018-02-17 DIAGNOSIS — K922 Gastrointestinal hemorrhage, unspecified: Secondary | ICD-10-CM | POA: Diagnosis not present

## 2018-02-17 DIAGNOSIS — I10 Essential (primary) hypertension: Secondary | ICD-10-CM | POA: Diagnosis not present

## 2018-02-17 DIAGNOSIS — M6281 Muscle weakness (generalized): Secondary | ICD-10-CM | POA: Diagnosis not present

## 2018-02-17 DIAGNOSIS — R109 Unspecified abdominal pain: Secondary | ICD-10-CM | POA: Diagnosis not present

## 2018-02-17 DIAGNOSIS — R5381 Other malaise: Secondary | ICD-10-CM | POA: Diagnosis not present

## 2018-02-17 DIAGNOSIS — Z170 Estrogen receptor positive status [ER+]: Secondary | ICD-10-CM | POA: Diagnosis not present

## 2018-02-17 DIAGNOSIS — K5731 Diverticulosis of large intestine without perforation or abscess with bleeding: Secondary | ICD-10-CM | POA: Diagnosis not present

## 2018-02-17 DIAGNOSIS — R110 Nausea: Secondary | ICD-10-CM | POA: Diagnosis not present

## 2018-02-17 DIAGNOSIS — I509 Heart failure, unspecified: Secondary | ICD-10-CM | POA: Diagnosis not present

## 2018-02-17 DIAGNOSIS — I5023 Acute on chronic systolic (congestive) heart failure: Secondary | ICD-10-CM | POA: Diagnosis not present

## 2018-02-17 DIAGNOSIS — D649 Anemia, unspecified: Secondary | ICD-10-CM | POA: Diagnosis not present

## 2018-02-17 DIAGNOSIS — Z9861 Coronary angioplasty status: Secondary | ICD-10-CM | POA: Diagnosis not present

## 2018-03-01 ENCOUNTER — Ambulatory Visit (HOSPITAL_COMMUNITY): Payer: Medicare Other | Admitting: Internal Medicine

## 2018-03-21 DIAGNOSIS — M47817 Spondylosis without myelopathy or radiculopathy, lumbosacral region: Secondary | ICD-10-CM | POA: Diagnosis not present

## 2018-03-21 DIAGNOSIS — Z7982 Long term (current) use of aspirin: Secondary | ICD-10-CM | POA: Diagnosis not present

## 2018-03-21 DIAGNOSIS — Z79899 Other long term (current) drug therapy: Secondary | ICD-10-CM | POA: Diagnosis not present

## 2018-03-21 DIAGNOSIS — I509 Heart failure, unspecified: Secondary | ICD-10-CM | POA: Diagnosis not present

## 2018-03-21 DIAGNOSIS — M545 Low back pain: Secondary | ICD-10-CM | POA: Diagnosis not present

## 2018-03-21 DIAGNOSIS — Z853 Personal history of malignant neoplasm of breast: Secondary | ICD-10-CM | POA: Diagnosis not present

## 2018-03-21 DIAGNOSIS — R52 Pain, unspecified: Secondary | ICD-10-CM | POA: Diagnosis not present

## 2018-03-21 DIAGNOSIS — M5489 Other dorsalgia: Secondary | ICD-10-CM | POA: Diagnosis not present

## 2018-03-21 DIAGNOSIS — M47816 Spondylosis without myelopathy or radiculopathy, lumbar region: Secondary | ICD-10-CM | POA: Diagnosis not present

## 2018-03-21 DIAGNOSIS — E119 Type 2 diabetes mellitus without complications: Secondary | ICD-10-CM | POA: Diagnosis not present

## 2018-03-21 DIAGNOSIS — M4726 Other spondylosis with radiculopathy, lumbar region: Secondary | ICD-10-CM | POA: Diagnosis not present

## 2018-03-21 DIAGNOSIS — I110 Hypertensive heart disease with heart failure: Secondary | ICD-10-CM | POA: Diagnosis not present

## 2018-03-21 DIAGNOSIS — Z7984 Long term (current) use of oral hypoglycemic drugs: Secondary | ICD-10-CM | POA: Diagnosis not present

## 2018-03-21 DIAGNOSIS — M5416 Radiculopathy, lumbar region: Secondary | ICD-10-CM | POA: Diagnosis not present

## 2018-03-23 DIAGNOSIS — R531 Weakness: Secondary | ICD-10-CM | POA: Diagnosis not present

## 2018-03-23 DIAGNOSIS — R5383 Other fatigue: Secondary | ICD-10-CM | POA: Diagnosis not present

## 2018-03-24 DIAGNOSIS — I5023 Acute on chronic systolic (congestive) heart failure: Secondary | ICD-10-CM | POA: Diagnosis not present

## 2018-03-24 DIAGNOSIS — M6281 Muscle weakness (generalized): Secondary | ICD-10-CM | POA: Diagnosis not present

## 2018-03-24 DIAGNOSIS — E119 Type 2 diabetes mellitus without complications: Secondary | ICD-10-CM | POA: Diagnosis not present

## 2018-03-24 DIAGNOSIS — I110 Hypertensive heart disease with heart failure: Secondary | ICD-10-CM | POA: Diagnosis not present

## 2018-03-24 DIAGNOSIS — C50911 Malignant neoplasm of unspecified site of right female breast: Secondary | ICD-10-CM | POA: Diagnosis not present

## 2018-03-24 DIAGNOSIS — D630 Anemia in neoplastic disease: Secondary | ICD-10-CM | POA: Diagnosis not present

## 2018-03-27 DIAGNOSIS — I5023 Acute on chronic systolic (congestive) heart failure: Secondary | ICD-10-CM | POA: Diagnosis not present

## 2018-03-27 DIAGNOSIS — I110 Hypertensive heart disease with heart failure: Secondary | ICD-10-CM | POA: Diagnosis not present

## 2018-03-27 DIAGNOSIS — E119 Type 2 diabetes mellitus without complications: Secondary | ICD-10-CM | POA: Diagnosis not present

## 2018-03-27 DIAGNOSIS — D630 Anemia in neoplastic disease: Secondary | ICD-10-CM | POA: Diagnosis not present

## 2018-03-27 DIAGNOSIS — C50911 Malignant neoplasm of unspecified site of right female breast: Secondary | ICD-10-CM | POA: Diagnosis not present

## 2018-03-27 DIAGNOSIS — M6281 Muscle weakness (generalized): Secondary | ICD-10-CM | POA: Diagnosis not present

## 2018-03-29 DIAGNOSIS — D630 Anemia in neoplastic disease: Secondary | ICD-10-CM | POA: Diagnosis not present

## 2018-03-29 DIAGNOSIS — I110 Hypertensive heart disease with heart failure: Secondary | ICD-10-CM | POA: Diagnosis not present

## 2018-03-29 DIAGNOSIS — E119 Type 2 diabetes mellitus without complications: Secondary | ICD-10-CM | POA: Diagnosis not present

## 2018-03-29 DIAGNOSIS — C50911 Malignant neoplasm of unspecified site of right female breast: Secondary | ICD-10-CM | POA: Diagnosis not present

## 2018-03-29 DIAGNOSIS — M6281 Muscle weakness (generalized): Secondary | ICD-10-CM | POA: Diagnosis not present

## 2018-03-29 DIAGNOSIS — I5023 Acute on chronic systolic (congestive) heart failure: Secondary | ICD-10-CM | POA: Diagnosis not present

## 2018-03-30 DIAGNOSIS — E119 Type 2 diabetes mellitus without complications: Secondary | ICD-10-CM | POA: Diagnosis not present

## 2018-03-30 DIAGNOSIS — I110 Hypertensive heart disease with heart failure: Secondary | ICD-10-CM | POA: Diagnosis not present

## 2018-03-30 DIAGNOSIS — D630 Anemia in neoplastic disease: Secondary | ICD-10-CM | POA: Diagnosis not present

## 2018-03-30 DIAGNOSIS — M6281 Muscle weakness (generalized): Secondary | ICD-10-CM | POA: Diagnosis not present

## 2018-03-30 DIAGNOSIS — C50911 Malignant neoplasm of unspecified site of right female breast: Secondary | ICD-10-CM | POA: Diagnosis not present

## 2018-03-30 DIAGNOSIS — I5023 Acute on chronic systolic (congestive) heart failure: Secondary | ICD-10-CM | POA: Diagnosis not present

## 2018-03-31 DIAGNOSIS — I110 Hypertensive heart disease with heart failure: Secondary | ICD-10-CM | POA: Diagnosis not present

## 2018-03-31 DIAGNOSIS — I10 Essential (primary) hypertension: Secondary | ICD-10-CM | POA: Diagnosis not present

## 2018-03-31 DIAGNOSIS — C50911 Malignant neoplasm of unspecified site of right female breast: Secondary | ICD-10-CM | POA: Diagnosis not present

## 2018-03-31 DIAGNOSIS — E119 Type 2 diabetes mellitus without complications: Secondary | ICD-10-CM | POA: Diagnosis not present

## 2018-03-31 DIAGNOSIS — D630 Anemia in neoplastic disease: Secondary | ICD-10-CM | POA: Diagnosis not present

## 2018-03-31 DIAGNOSIS — M6281 Muscle weakness (generalized): Secondary | ICD-10-CM | POA: Diagnosis not present

## 2018-03-31 DIAGNOSIS — I5023 Acute on chronic systolic (congestive) heart failure: Secondary | ICD-10-CM | POA: Diagnosis not present

## 2018-04-03 DIAGNOSIS — D630 Anemia in neoplastic disease: Secondary | ICD-10-CM | POA: Diagnosis not present

## 2018-04-03 DIAGNOSIS — M6281 Muscle weakness (generalized): Secondary | ICD-10-CM | POA: Diagnosis not present

## 2018-04-03 DIAGNOSIS — I110 Hypertensive heart disease with heart failure: Secondary | ICD-10-CM | POA: Diagnosis not present

## 2018-04-03 DIAGNOSIS — E119 Type 2 diabetes mellitus without complications: Secondary | ICD-10-CM | POA: Diagnosis not present

## 2018-04-03 DIAGNOSIS — I5023 Acute on chronic systolic (congestive) heart failure: Secondary | ICD-10-CM | POA: Diagnosis not present

## 2018-04-03 DIAGNOSIS — C50911 Malignant neoplasm of unspecified site of right female breast: Secondary | ICD-10-CM | POA: Diagnosis not present

## 2018-04-05 DIAGNOSIS — D630 Anemia in neoplastic disease: Secondary | ICD-10-CM | POA: Diagnosis not present

## 2018-04-05 DIAGNOSIS — E1140 Type 2 diabetes mellitus with diabetic neuropathy, unspecified: Secondary | ICD-10-CM | POA: Diagnosis not present

## 2018-04-05 DIAGNOSIS — C50911 Malignant neoplasm of unspecified site of right female breast: Secondary | ICD-10-CM | POA: Diagnosis not present

## 2018-04-05 DIAGNOSIS — M6281 Muscle weakness (generalized): Secondary | ICD-10-CM | POA: Diagnosis not present

## 2018-04-05 DIAGNOSIS — E119 Type 2 diabetes mellitus without complications: Secondary | ICD-10-CM | POA: Diagnosis not present

## 2018-04-05 DIAGNOSIS — I110 Hypertensive heart disease with heart failure: Secondary | ICD-10-CM | POA: Diagnosis not present

## 2018-04-05 DIAGNOSIS — E1151 Type 2 diabetes mellitus with diabetic peripheral angiopathy without gangrene: Secondary | ICD-10-CM | POA: Diagnosis not present

## 2018-04-05 DIAGNOSIS — I5023 Acute on chronic systolic (congestive) heart failure: Secondary | ICD-10-CM | POA: Diagnosis not present

## 2018-04-06 DIAGNOSIS — C50911 Malignant neoplasm of unspecified site of right female breast: Secondary | ICD-10-CM | POA: Diagnosis not present

## 2018-04-06 DIAGNOSIS — M6281 Muscle weakness (generalized): Secondary | ICD-10-CM | POA: Diagnosis not present

## 2018-04-06 DIAGNOSIS — E119 Type 2 diabetes mellitus without complications: Secondary | ICD-10-CM | POA: Diagnosis not present

## 2018-04-06 DIAGNOSIS — I110 Hypertensive heart disease with heart failure: Secondary | ICD-10-CM | POA: Diagnosis not present

## 2018-04-06 DIAGNOSIS — D630 Anemia in neoplastic disease: Secondary | ICD-10-CM | POA: Diagnosis not present

## 2018-04-06 DIAGNOSIS — I5023 Acute on chronic systolic (congestive) heart failure: Secondary | ICD-10-CM | POA: Diagnosis not present

## 2018-04-10 DIAGNOSIS — M6281 Muscle weakness (generalized): Secondary | ICD-10-CM | POA: Diagnosis not present

## 2018-04-10 DIAGNOSIS — I110 Hypertensive heart disease with heart failure: Secondary | ICD-10-CM | POA: Diagnosis not present

## 2018-04-10 DIAGNOSIS — D630 Anemia in neoplastic disease: Secondary | ICD-10-CM | POA: Diagnosis not present

## 2018-04-10 DIAGNOSIS — E119 Type 2 diabetes mellitus without complications: Secondary | ICD-10-CM | POA: Diagnosis not present

## 2018-04-10 DIAGNOSIS — I5023 Acute on chronic systolic (congestive) heart failure: Secondary | ICD-10-CM | POA: Diagnosis not present

## 2018-04-10 DIAGNOSIS — C50911 Malignant neoplasm of unspecified site of right female breast: Secondary | ICD-10-CM | POA: Diagnosis not present

## 2018-04-12 DIAGNOSIS — C50911 Malignant neoplasm of unspecified site of right female breast: Secondary | ICD-10-CM | POA: Diagnosis not present

## 2018-04-12 DIAGNOSIS — I5023 Acute on chronic systolic (congestive) heart failure: Secondary | ICD-10-CM | POA: Diagnosis not present

## 2018-04-12 DIAGNOSIS — E119 Type 2 diabetes mellitus without complications: Secondary | ICD-10-CM | POA: Diagnosis not present

## 2018-04-12 DIAGNOSIS — I110 Hypertensive heart disease with heart failure: Secondary | ICD-10-CM | POA: Diagnosis not present

## 2018-04-12 DIAGNOSIS — M6281 Muscle weakness (generalized): Secondary | ICD-10-CM | POA: Diagnosis not present

## 2018-04-12 DIAGNOSIS — D630 Anemia in neoplastic disease: Secondary | ICD-10-CM | POA: Diagnosis not present

## 2018-04-13 DIAGNOSIS — D630 Anemia in neoplastic disease: Secondary | ICD-10-CM | POA: Diagnosis not present

## 2018-04-13 DIAGNOSIS — C50911 Malignant neoplasm of unspecified site of right female breast: Secondary | ICD-10-CM | POA: Diagnosis not present

## 2018-04-13 DIAGNOSIS — E119 Type 2 diabetes mellitus without complications: Secondary | ICD-10-CM | POA: Diagnosis not present

## 2018-04-13 DIAGNOSIS — M6281 Muscle weakness (generalized): Secondary | ICD-10-CM | POA: Diagnosis not present

## 2018-04-13 DIAGNOSIS — I5023 Acute on chronic systolic (congestive) heart failure: Secondary | ICD-10-CM | POA: Diagnosis not present

## 2018-04-13 DIAGNOSIS — I110 Hypertensive heart disease with heart failure: Secondary | ICD-10-CM | POA: Diagnosis not present

## 2018-04-14 ENCOUNTER — Other Ambulatory Visit: Payer: Self-pay

## 2018-04-14 ENCOUNTER — Inpatient Hospital Stay (HOSPITAL_COMMUNITY): Payer: Medicare Other | Attending: Oncology | Admitting: Hematology

## 2018-04-14 ENCOUNTER — Encounter (HOSPITAL_COMMUNITY): Payer: Self-pay | Admitting: Hematology

## 2018-04-14 VITALS — BP 153/56 | HR 56 | Temp 98.8°F | Resp 16

## 2018-04-14 DIAGNOSIS — Z7982 Long term (current) use of aspirin: Secondary | ICD-10-CM | POA: Diagnosis not present

## 2018-04-14 DIAGNOSIS — N951 Menopausal and female climacteric states: Secondary | ICD-10-CM

## 2018-04-14 DIAGNOSIS — E119 Type 2 diabetes mellitus without complications: Secondary | ICD-10-CM | POA: Diagnosis not present

## 2018-04-14 DIAGNOSIS — Z7984 Long term (current) use of oral hypoglycemic drugs: Secondary | ICD-10-CM | POA: Diagnosis not present

## 2018-04-14 DIAGNOSIS — Z79899 Other long term (current) drug therapy: Secondary | ICD-10-CM | POA: Diagnosis not present

## 2018-04-14 DIAGNOSIS — Z79811 Long term (current) use of aromatase inhibitors: Secondary | ICD-10-CM | POA: Diagnosis not present

## 2018-04-14 DIAGNOSIS — R911 Solitary pulmonary nodule: Secondary | ICD-10-CM

## 2018-04-14 DIAGNOSIS — R42 Dizziness and giddiness: Secondary | ICD-10-CM

## 2018-04-14 DIAGNOSIS — M810 Age-related osteoporosis without current pathological fracture: Secondary | ICD-10-CM

## 2018-04-14 DIAGNOSIS — K5900 Constipation, unspecified: Secondary | ICD-10-CM | POA: Diagnosis not present

## 2018-04-14 DIAGNOSIS — R530 Neoplastic (malignant) related fatigue: Secondary | ICD-10-CM | POA: Diagnosis not present

## 2018-04-14 DIAGNOSIS — Z170 Estrogen receptor positive status [ER+]: Secondary | ICD-10-CM | POA: Diagnosis not present

## 2018-04-14 DIAGNOSIS — C50211 Malignant neoplasm of upper-inner quadrant of right female breast: Secondary | ICD-10-CM | POA: Diagnosis not present

## 2018-04-14 NOTE — Progress Notes (Signed)
Kelly Fisher, [place] [address]   CLINIC:  Medical Oncology/Hematology  PCP:  Neale Burly, MD [place] [id number] [phone]   REASON FOR VISIT:  Follow-up for right breast cancer.  CURRENT THERAPY: Anastrozole.  BRIEF ONCOLOGIC HISTORY:    Malignant neoplasm of upper-inner quadrant of right breast in female, estrogen receptor positive ([place])   09/08/2016 Mammogram    Postprocedure bilateral mammogram, for clip placements. Bilateral cylinder shaped biopsy clips appear well positioned at the sites of the targeted masses. R breast mass at the 1:00 position, L breast mass at the 8:00 position      09/08/2016 Initial Biopsy    Ultrasound guided biopsy of bilateral breast masses      09/08/2016 Pathology Results    R breast invasive ductal carcinoma, low grade with focal micro-calcifications. 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 ER+ 90%, PR 90% HER 2 IHC 2+, FISH NEGATIVE        CANCER STAGING: Cancer Staging No matching staging information was found for the patient.   INTERVAL HISTORY:  Kelly Fisher 84 y.o. female returns for routine follow-up of right breast cancer.  She is taking anastrozole since her diagnosis in October 2017.  She refused surgery at that time.  She is tolerating anastrozole very well.  She denies any musculoskeletal symptoms.  She has occasional hot flashes.  She is able to function independently.  Denies any recent hospitalizations or ER visits.  Denies any fevers, night sweats or weight loss.  Denies any new onset pains.  Takes calcium supplements.  REVIEW OF SYSTEMS:  Review of Systems  Constitutional: Positive for fatigue.  Gastrointestinal: Positive for constipation.  Neurological: Positive for dizziness.  Hematological: Bruises/bleeds easily.  All other systems reviewed and are negative.    PAST MEDICAL/SURGICAL HISTORY:  Past Medical History:  Diagnosis Date  .  CAD (coronary artery disease)   . High cholesterol   . Hypertension    Past Surgical History:  Procedure Laterality Date  . CORONARY ARTERY BYPASS GRAFT    . HIP FRACTURE SURGERY Left   . TOTAL KNEE ARTHROPLASTY Right      SOCIAL HISTORY:  Social History   Socioeconomic History  . Marital status: Married    Spouse name: Not on file  . Number of children: Not on file  . Years of education: Not on file  . Highest education level: Not on file  Occupational History  . Not on file  Social Needs  . Financial resource strain: Not on file  . Food insecurity:    Worry: Not on file    Inability: Not on file  . Transportation needs:    Medical: Not on file    Non-medical: Not on file  Tobacco Use  . Smoking status: Never Smoker  . Smokeless tobacco: Never Used  Substance and Sexual Activity  . Alcohol use: No  . Drug use: No  . Sexual activity: Not on file    Comment: married  Lifestyle  . Physical activity:    Days per week: Not on file    Minutes per session: Not on file  . Stress: Not on file  Relationships  . Social connections:    Talks on phone: Not on file    Gets together: Not on file    Attends religious service: Not on file    Active member of club or organization: Not on file    Attends meetings  of clubs or organizations: Not on file    Relationship status: Not on file  . Intimate partner violence:    Fear of current or ex partner: Not on file    Emotionally abused: Not on file    Physically abused: Not on file    Forced sexual activity: Not on file  Other Topics Concern  . Not on file  Social History Narrative  . Not on file    FAMILY HISTORY:  History reviewed. No pertinent family history.  CURRENT MEDICATIONS:  Outpatient Encounter Medications as of 04/14/2018  Medication Sig  . allopurinol (ZYLOPRIM) 300 MG tablet Take 300 mg by mouth daily.  Marland Kitchen anastrozole (ARIMIDEX) 1 MG tablet Take 1 mg by mouth daily.  Marland Kitchen aspirin EC 81 MG tablet Take 81 mg  by mouth daily.  . calcium-vitamin D (OSCAL WITH D) 500-200 MG-UNIT tablet Take 2 tablets by mouth daily with breakfast.  . clopidogrel (PLAVIX) 75 MG tablet Take 75 mg by mouth daily.  . ferrous sulfate 325 (65 FE) MG tablet Take by mouth.  . lidocaine (LIDODERM) 5 % Apply patch to painful area. Patch may remain in place for up to 12 hours in a 24 hour period.  Marland Kitchen lisinopril (PRINIVIL,ZESTRIL) 10 MG tablet Take 10 mg by mouth daily.   . magnesium oxide (MAG-OX) 400 MG tablet Take by mouth.  . Melatonin 3 MG TABS Take by mouth.  . metaxalone (SKELAXIN) 800 MG tablet TAKE 1 TABLET BY MOUTH 3 TIMES A DAY AS NEEDED FOR MUSCLE SPASM  . metFORMIN (GLUCOPHAGE) 500 MG tablet Take 1,000 mg by mouth 2 (two) times daily.  . metoprolol tartrate (LOPRESSOR) 25 MG tablet Take 25 mg by mouth daily.  Marland Kitchen oxyCODONE-acetaminophen (PERCOCET/ROXICET) 5-325 MG tablet Take 1 tablet by mouth every 6 (six) hours as needed. for pain  . pantoprazole (PROTONIX) 40 MG tablet Take 40 mg by mouth daily.  . polyethylene glycol (MIRALAX / GLYCOLAX) packet Take 17 g by mouth.  . simvastatin (ZOCOR) 40 MG tablet Take 40 mg by mouth.   No facility-administered encounter medications on file as of 04/14/2018.     ALLERGIES:  No Known Allergies   PHYSICAL EXAM:  ECOG Performance status: 1  Vitals:   04/14/18 1424  BP: (!) 153/56  Pulse: (!) 56  Resp: 16  Temp: 98.8 F (37.1 C)  SpO2: 100%   There were no vitals filed for this visit.  Physical Exam Awake alert oriented x3, no focal deficits. Chest is bilaterally clear to auscultation. Cardiovascular S1-S2 regular rate and rhythm. Right breast has palpable 2.5 x 2.4 cm upper inner quadrant mass which is freely mobile.  No palpable supraclavicular or axillary adenopathy.  No palpable left breast masses.  LABORATORY DATA:  I have reviewed the labs as listed.  CBC    Component Value Date/Time   WBC 5.1 01/16/2018 0910   RBC 3.71 (L) 01/16/2018 0910   HGB 10.4  (L) 01/16/2018 0910   HCT 34.5 (L) 01/16/2018 0910   PLT 201 01/16/2018 0910   MCV 93.0 01/16/2018 0910   MCH 28.0 01/16/2018 0910   MCHC 30.1 01/16/2018 0910   RDW 15.4 01/16/2018 0910   LYMPHSABS 1.0 01/16/2018 0910   MONOABS 0.5 01/16/2018 0910   EOSABS 0.2 01/16/2018 0910   BASOSABS 0.0 01/16/2018 0910   CMP Latest Ref Rng & Units 01/16/2018 07/15/2017 03/30/2017  Glucose 65 - 99 mg/dL 117(H) 110(H) 82  BUN 6 - 20 mg/dL _0 Creatinine  0.44 - 1.00 mg/dL 0.88 0.73 0.68  Sodium 135 - 145 mmol/L 139 138 140  Potassium 3.5 - 5.1 mmol/L 3.9 4.4 4.4  Chloride 101 - 111 mmol/L 103 104 105  CO2 22 - 32 mmol/L _0 Calcium 8.9 - 10.3 mg/dL 8.8(L) 9.3 9.5  Total Protein 6.5 - 8.1 g/dL 7.1 7.1 6.8  Total Bilirubin 0.3 - 1.2 mg/dL 0.6 0.5 0.6  Alkaline Phos 38 - 126 U/L 55 52 48  AST 15 - 41 U/L _1 ALT 14 - 54 U/L 10(L) 15 11(L)       DIAGNOSTIC IMAGING:  I have reviewed her prior PET scan from 2017.    ASSESSMENT & PLAN:   Malignant neoplasm of upper-inner quadrant of right breast in female, estrogen receptor positive ([place]) 1.  Right breast infiltrating ductal carcinoma, ER/PR positive and HER-2 negative: -Diagnosed on 09/08/2016 by biopsy, surgery was refused, anastrozole started - Ultrasound of the right breast in September 2018 showed improvement in size, ultrasound in February 2019 showed more or less stable disease. -Clinically today the lump is in the upper inner quadrant, measuring 2.5 x 2 cm.  Freely mobile.  It is nontender.  No palpable adenopathy. -She is tolerating anastrozole reasonably well.  She has occasional hot flashes.  She will continue with until next visit.  We will see her back in last week of August.  I will repeat ultrasound at that time.  We will also do blood work.  2.  Osteoporosis: -On Prolia injections every 6 months.  Last injection on 01/16/2018.  We will plan to give it at next visit in August.  3.  Right middle lobe lung  nodule: -On PET scan in November 2017 has shown slight growth since 2014.  This was hypermetabolic.  We will follow-up on night with a CT scan prior to next visit.      Orders placed this encounter:  Orders Placed This Encounter  Procedures  . US Breast Limited Uni Right Inc Axilla  . CT Chest W Contrast  . Cancer antigen 15-3  . CBC with Differential ([place] Only)  . CMP ([place] only)      Derek Jack, MD [place] [phone]

## 2018-04-14 NOTE — Assessment & Plan Note (Signed)
1.  Right breast infiltrating ductal carcinoma, ER/PR positive and HER-2 negative: -Diagnosed on 09/08/2016 by biopsy, surgery was refused, anastrozole started - Ultrasound of the right breast in September 2018 showed improvement in size, ultrasound in February 2019 showed more or less stable disease. -Clinically today the lump is in the upper inner quadrant, measuring 2.5 x 2 cm.  Freely mobile.  It is nontender.  No palpable adenopathy. -She is tolerating anastrozole reasonably well.  She has occasional hot flashes.  She will continue with until next visit.  We will see her back in last week of August.  I will repeat ultrasound at that time.  We will also do blood work.  2.  Osteoporosis: -On Prolia injections every 6 months.  Last injection on 01/16/2018.  We will plan to give it at next visit in August.  3.  Right middle lobe lung nodule: -On PET scan in November 2017 has shown slight growth since 2014.  This was hypermetabolic.  We will follow-up on night with a CT scan prior to next visit.

## 2018-04-19 DIAGNOSIS — D630 Anemia in neoplastic disease: Secondary | ICD-10-CM | POA: Diagnosis not present

## 2018-04-19 DIAGNOSIS — E119 Type 2 diabetes mellitus without complications: Secondary | ICD-10-CM | POA: Diagnosis not present

## 2018-04-19 DIAGNOSIS — I5023 Acute on chronic systolic (congestive) heart failure: Secondary | ICD-10-CM | POA: Diagnosis not present

## 2018-04-19 DIAGNOSIS — I110 Hypertensive heart disease with heart failure: Secondary | ICD-10-CM | POA: Diagnosis not present

## 2018-04-19 DIAGNOSIS — C50911 Malignant neoplasm of unspecified site of right female breast: Secondary | ICD-10-CM | POA: Diagnosis not present

## 2018-04-19 DIAGNOSIS — M6281 Muscle weakness (generalized): Secondary | ICD-10-CM | POA: Diagnosis not present

## 2018-04-21 DIAGNOSIS — I5023 Acute on chronic systolic (congestive) heart failure: Secondary | ICD-10-CM | POA: Diagnosis not present

## 2018-04-21 DIAGNOSIS — M6281 Muscle weakness (generalized): Secondary | ICD-10-CM | POA: Diagnosis not present

## 2018-04-21 DIAGNOSIS — C50911 Malignant neoplasm of unspecified site of right female breast: Secondary | ICD-10-CM | POA: Diagnosis not present

## 2018-04-21 DIAGNOSIS — D630 Anemia in neoplastic disease: Secondary | ICD-10-CM | POA: Diagnosis not present

## 2018-04-21 DIAGNOSIS — I110 Hypertensive heart disease with heart failure: Secondary | ICD-10-CM | POA: Diagnosis not present

## 2018-04-21 DIAGNOSIS — E119 Type 2 diabetes mellitus without complications: Secondary | ICD-10-CM | POA: Diagnosis not present

## 2018-04-25 DIAGNOSIS — E119 Type 2 diabetes mellitus without complications: Secondary | ICD-10-CM | POA: Diagnosis not present

## 2018-04-25 DIAGNOSIS — C50911 Malignant neoplasm of unspecified site of right female breast: Secondary | ICD-10-CM | POA: Diagnosis not present

## 2018-04-25 DIAGNOSIS — D630 Anemia in neoplastic disease: Secondary | ICD-10-CM | POA: Diagnosis not present

## 2018-04-25 DIAGNOSIS — M6281 Muscle weakness (generalized): Secondary | ICD-10-CM | POA: Diagnosis not present

## 2018-04-25 DIAGNOSIS — I5023 Acute on chronic systolic (congestive) heart failure: Secondary | ICD-10-CM | POA: Diagnosis not present

## 2018-04-25 DIAGNOSIS — I110 Hypertensive heart disease with heart failure: Secondary | ICD-10-CM | POA: Diagnosis not present

## 2018-04-27 DIAGNOSIS — I110 Hypertensive heart disease with heart failure: Secondary | ICD-10-CM | POA: Diagnosis not present

## 2018-04-27 DIAGNOSIS — E119 Type 2 diabetes mellitus without complications: Secondary | ICD-10-CM | POA: Diagnosis not present

## 2018-04-27 DIAGNOSIS — I5023 Acute on chronic systolic (congestive) heart failure: Secondary | ICD-10-CM | POA: Diagnosis not present

## 2018-04-27 DIAGNOSIS — M6281 Muscle weakness (generalized): Secondary | ICD-10-CM | POA: Diagnosis not present

## 2018-04-27 DIAGNOSIS — C50911 Malignant neoplasm of unspecified site of right female breast: Secondary | ICD-10-CM | POA: Diagnosis not present

## 2018-04-27 DIAGNOSIS — D630 Anemia in neoplastic disease: Secondary | ICD-10-CM | POA: Diagnosis not present

## 2018-04-28 DIAGNOSIS — I110 Hypertensive heart disease with heart failure: Secondary | ICD-10-CM | POA: Diagnosis not present

## 2018-04-28 DIAGNOSIS — E119 Type 2 diabetes mellitus without complications: Secondary | ICD-10-CM | POA: Diagnosis not present

## 2018-04-28 DIAGNOSIS — M6281 Muscle weakness (generalized): Secondary | ICD-10-CM | POA: Diagnosis not present

## 2018-04-28 DIAGNOSIS — I5023 Acute on chronic systolic (congestive) heart failure: Secondary | ICD-10-CM | POA: Diagnosis not present

## 2018-04-28 DIAGNOSIS — D630 Anemia in neoplastic disease: Secondary | ICD-10-CM | POA: Diagnosis not present

## 2018-04-28 DIAGNOSIS — C50911 Malignant neoplasm of unspecified site of right female breast: Secondary | ICD-10-CM | POA: Diagnosis not present

## 2018-05-03 DIAGNOSIS — C50911 Malignant neoplasm of unspecified site of right female breast: Secondary | ICD-10-CM | POA: Diagnosis not present

## 2018-05-03 DIAGNOSIS — M6281 Muscle weakness (generalized): Secondary | ICD-10-CM | POA: Diagnosis not present

## 2018-05-03 DIAGNOSIS — I110 Hypertensive heart disease with heart failure: Secondary | ICD-10-CM | POA: Diagnosis not present

## 2018-05-03 DIAGNOSIS — E119 Type 2 diabetes mellitus without complications: Secondary | ICD-10-CM | POA: Diagnosis not present

## 2018-05-03 DIAGNOSIS — D630 Anemia in neoplastic disease: Secondary | ICD-10-CM | POA: Diagnosis not present

## 2018-05-03 DIAGNOSIS — I5023 Acute on chronic systolic (congestive) heart failure: Secondary | ICD-10-CM | POA: Diagnosis not present

## 2018-05-05 DIAGNOSIS — I110 Hypertensive heart disease with heart failure: Secondary | ICD-10-CM | POA: Diagnosis not present

## 2018-05-05 DIAGNOSIS — D630 Anemia in neoplastic disease: Secondary | ICD-10-CM | POA: Diagnosis not present

## 2018-05-05 DIAGNOSIS — I5023 Acute on chronic systolic (congestive) heart failure: Secondary | ICD-10-CM | POA: Diagnosis not present

## 2018-05-05 DIAGNOSIS — M6281 Muscle weakness (generalized): Secondary | ICD-10-CM | POA: Diagnosis not present

## 2018-05-05 DIAGNOSIS — E119 Type 2 diabetes mellitus without complications: Secondary | ICD-10-CM | POA: Diagnosis not present

## 2018-05-05 DIAGNOSIS — C50911 Malignant neoplasm of unspecified site of right female breast: Secondary | ICD-10-CM | POA: Diagnosis not present

## 2018-05-08 DIAGNOSIS — M6281 Muscle weakness (generalized): Secondary | ICD-10-CM | POA: Diagnosis not present

## 2018-05-08 DIAGNOSIS — I5023 Acute on chronic systolic (congestive) heart failure: Secondary | ICD-10-CM | POA: Diagnosis not present

## 2018-05-08 DIAGNOSIS — C50911 Malignant neoplasm of unspecified site of right female breast: Secondary | ICD-10-CM | POA: Diagnosis not present

## 2018-05-08 DIAGNOSIS — E119 Type 2 diabetes mellitus without complications: Secondary | ICD-10-CM | POA: Diagnosis not present

## 2018-05-08 DIAGNOSIS — D630 Anemia in neoplastic disease: Secondary | ICD-10-CM | POA: Diagnosis not present

## 2018-05-08 DIAGNOSIS — I110 Hypertensive heart disease with heart failure: Secondary | ICD-10-CM | POA: Diagnosis not present

## 2018-05-10 DIAGNOSIS — D508 Other iron deficiency anemias: Secondary | ICD-10-CM | POA: Diagnosis not present

## 2018-05-10 DIAGNOSIS — R5383 Other fatigue: Secondary | ICD-10-CM | POA: Diagnosis not present

## 2018-05-10 DIAGNOSIS — I10 Essential (primary) hypertension: Secondary | ICD-10-CM | POA: Diagnosis not present

## 2018-05-10 DIAGNOSIS — R531 Weakness: Secondary | ICD-10-CM | POA: Diagnosis not present

## 2018-05-10 DIAGNOSIS — M10242 Drug-induced gout, left hand: Secondary | ICD-10-CM | POA: Diagnosis not present

## 2018-05-10 DIAGNOSIS — K210 Gastro-esophageal reflux disease with esophagitis: Secondary | ICD-10-CM | POA: Diagnosis not present

## 2018-05-10 DIAGNOSIS — E1165 Type 2 diabetes mellitus with hyperglycemia: Secondary | ICD-10-CM | POA: Diagnosis not present

## 2018-05-11 DIAGNOSIS — M6281 Muscle weakness (generalized): Secondary | ICD-10-CM | POA: Diagnosis not present

## 2018-05-11 DIAGNOSIS — E119 Type 2 diabetes mellitus without complications: Secondary | ICD-10-CM | POA: Diagnosis not present

## 2018-05-11 DIAGNOSIS — D630 Anemia in neoplastic disease: Secondary | ICD-10-CM | POA: Diagnosis not present

## 2018-05-11 DIAGNOSIS — I5023 Acute on chronic systolic (congestive) heart failure: Secondary | ICD-10-CM | POA: Diagnosis not present

## 2018-05-11 DIAGNOSIS — C50911 Malignant neoplasm of unspecified site of right female breast: Secondary | ICD-10-CM | POA: Diagnosis not present

## 2018-05-11 DIAGNOSIS — I110 Hypertensive heart disease with heart failure: Secondary | ICD-10-CM | POA: Diagnosis not present

## 2018-05-12 DIAGNOSIS — C50911 Malignant neoplasm of unspecified site of right female breast: Secondary | ICD-10-CM | POA: Diagnosis not present

## 2018-05-12 DIAGNOSIS — M6281 Muscle weakness (generalized): Secondary | ICD-10-CM | POA: Diagnosis not present

## 2018-05-12 DIAGNOSIS — D630 Anemia in neoplastic disease: Secondary | ICD-10-CM | POA: Diagnosis not present

## 2018-05-12 DIAGNOSIS — E119 Type 2 diabetes mellitus without complications: Secondary | ICD-10-CM | POA: Diagnosis not present

## 2018-05-12 DIAGNOSIS — I5023 Acute on chronic systolic (congestive) heart failure: Secondary | ICD-10-CM | POA: Diagnosis not present

## 2018-05-12 DIAGNOSIS — I110 Hypertensive heart disease with heart failure: Secondary | ICD-10-CM | POA: Diagnosis not present

## 2018-05-18 DIAGNOSIS — E119 Type 2 diabetes mellitus without complications: Secondary | ICD-10-CM | POA: Diagnosis not present

## 2018-05-18 DIAGNOSIS — C50911 Malignant neoplasm of unspecified site of right female breast: Secondary | ICD-10-CM | POA: Diagnosis not present

## 2018-05-18 DIAGNOSIS — M6281 Muscle weakness (generalized): Secondary | ICD-10-CM | POA: Diagnosis not present

## 2018-05-18 DIAGNOSIS — I5023 Acute on chronic systolic (congestive) heart failure: Secondary | ICD-10-CM | POA: Diagnosis not present

## 2018-05-18 DIAGNOSIS — D630 Anemia in neoplastic disease: Secondary | ICD-10-CM | POA: Diagnosis not present

## 2018-05-18 DIAGNOSIS — I110 Hypertensive heart disease with heart failure: Secondary | ICD-10-CM | POA: Diagnosis not present

## 2018-05-23 DIAGNOSIS — I110 Hypertensive heart disease with heart failure: Secondary | ICD-10-CM | POA: Diagnosis not present

## 2018-05-23 DIAGNOSIS — D630 Anemia in neoplastic disease: Secondary | ICD-10-CM | POA: Diagnosis not present

## 2018-05-23 DIAGNOSIS — C50911 Malignant neoplasm of unspecified site of right female breast: Secondary | ICD-10-CM | POA: Diagnosis not present

## 2018-05-23 DIAGNOSIS — E119 Type 2 diabetes mellitus without complications: Secondary | ICD-10-CM | POA: Diagnosis not present

## 2018-05-23 DIAGNOSIS — M1991 Primary osteoarthritis, unspecified site: Secondary | ICD-10-CM | POA: Diagnosis not present

## 2018-05-23 DIAGNOSIS — I5023 Acute on chronic systolic (congestive) heart failure: Secondary | ICD-10-CM | POA: Diagnosis not present

## 2018-05-24 DIAGNOSIS — C50911 Malignant neoplasm of unspecified site of right female breast: Secondary | ICD-10-CM | POA: Diagnosis not present

## 2018-05-24 DIAGNOSIS — I110 Hypertensive heart disease with heart failure: Secondary | ICD-10-CM | POA: Diagnosis not present

## 2018-05-24 DIAGNOSIS — I5023 Acute on chronic systolic (congestive) heart failure: Secondary | ICD-10-CM | POA: Diagnosis not present

## 2018-05-24 DIAGNOSIS — M1991 Primary osteoarthritis, unspecified site: Secondary | ICD-10-CM | POA: Diagnosis not present

## 2018-05-24 DIAGNOSIS — D630 Anemia in neoplastic disease: Secondary | ICD-10-CM | POA: Diagnosis not present

## 2018-05-24 DIAGNOSIS — E119 Type 2 diabetes mellitus without complications: Secondary | ICD-10-CM | POA: Diagnosis not present

## 2018-05-26 DIAGNOSIS — E119 Type 2 diabetes mellitus without complications: Secondary | ICD-10-CM | POA: Diagnosis not present

## 2018-05-26 DIAGNOSIS — I110 Hypertensive heart disease with heart failure: Secondary | ICD-10-CM | POA: Diagnosis not present

## 2018-05-26 DIAGNOSIS — I5023 Acute on chronic systolic (congestive) heart failure: Secondary | ICD-10-CM | POA: Diagnosis not present

## 2018-05-26 DIAGNOSIS — M1991 Primary osteoarthritis, unspecified site: Secondary | ICD-10-CM | POA: Diagnosis not present

## 2018-05-26 DIAGNOSIS — D630 Anemia in neoplastic disease: Secondary | ICD-10-CM | POA: Diagnosis not present

## 2018-05-26 DIAGNOSIS — C50911 Malignant neoplasm of unspecified site of right female breast: Secondary | ICD-10-CM | POA: Diagnosis not present

## 2018-06-01 DIAGNOSIS — I110 Hypertensive heart disease with heart failure: Secondary | ICD-10-CM | POA: Diagnosis not present

## 2018-06-01 DIAGNOSIS — D630 Anemia in neoplastic disease: Secondary | ICD-10-CM | POA: Diagnosis not present

## 2018-06-01 DIAGNOSIS — C50911 Malignant neoplasm of unspecified site of right female breast: Secondary | ICD-10-CM | POA: Diagnosis not present

## 2018-06-01 DIAGNOSIS — M1991 Primary osteoarthritis, unspecified site: Secondary | ICD-10-CM | POA: Diagnosis not present

## 2018-06-01 DIAGNOSIS — I5023 Acute on chronic systolic (congestive) heart failure: Secondary | ICD-10-CM | POA: Diagnosis not present

## 2018-06-01 DIAGNOSIS — E119 Type 2 diabetes mellitus without complications: Secondary | ICD-10-CM | POA: Diagnosis not present

## 2018-06-08 DIAGNOSIS — C50911 Malignant neoplasm of unspecified site of right female breast: Secondary | ICD-10-CM | POA: Diagnosis not present

## 2018-06-08 DIAGNOSIS — D630 Anemia in neoplastic disease: Secondary | ICD-10-CM | POA: Diagnosis not present

## 2018-06-08 DIAGNOSIS — I5023 Acute on chronic systolic (congestive) heart failure: Secondary | ICD-10-CM | POA: Diagnosis not present

## 2018-06-08 DIAGNOSIS — I110 Hypertensive heart disease with heart failure: Secondary | ICD-10-CM | POA: Diagnosis not present

## 2018-06-08 DIAGNOSIS — E119 Type 2 diabetes mellitus without complications: Secondary | ICD-10-CM | POA: Diagnosis not present

## 2018-06-08 DIAGNOSIS — M1991 Primary osteoarthritis, unspecified site: Secondary | ICD-10-CM | POA: Diagnosis not present

## 2018-06-14 DIAGNOSIS — E119 Type 2 diabetes mellitus without complications: Secondary | ICD-10-CM | POA: Diagnosis not present

## 2018-06-14 DIAGNOSIS — M1991 Primary osteoarthritis, unspecified site: Secondary | ICD-10-CM | POA: Diagnosis not present

## 2018-06-14 DIAGNOSIS — I5023 Acute on chronic systolic (congestive) heart failure: Secondary | ICD-10-CM | POA: Diagnosis not present

## 2018-06-14 DIAGNOSIS — D630 Anemia in neoplastic disease: Secondary | ICD-10-CM | POA: Diagnosis not present

## 2018-06-14 DIAGNOSIS — I110 Hypertensive heart disease with heart failure: Secondary | ICD-10-CM | POA: Diagnosis not present

## 2018-06-14 DIAGNOSIS — C50911 Malignant neoplasm of unspecified site of right female breast: Secondary | ICD-10-CM | POA: Diagnosis not present

## 2018-06-21 DIAGNOSIS — E119 Type 2 diabetes mellitus without complications: Secondary | ICD-10-CM | POA: Diagnosis not present

## 2018-06-21 DIAGNOSIS — D630 Anemia in neoplastic disease: Secondary | ICD-10-CM | POA: Diagnosis not present

## 2018-06-21 DIAGNOSIS — I110 Hypertensive heart disease with heart failure: Secondary | ICD-10-CM | POA: Diagnosis not present

## 2018-06-21 DIAGNOSIS — C50911 Malignant neoplasm of unspecified site of right female breast: Secondary | ICD-10-CM | POA: Diagnosis not present

## 2018-06-21 DIAGNOSIS — I5023 Acute on chronic systolic (congestive) heart failure: Secondary | ICD-10-CM | POA: Diagnosis not present

## 2018-06-21 DIAGNOSIS — M1991 Primary osteoarthritis, unspecified site: Secondary | ICD-10-CM | POA: Diagnosis not present

## 2018-06-28 DIAGNOSIS — E1165 Type 2 diabetes mellitus with hyperglycemia: Secondary | ICD-10-CM | POA: Diagnosis not present

## 2018-06-28 DIAGNOSIS — I10 Essential (primary) hypertension: Secondary | ICD-10-CM | POA: Diagnosis not present

## 2018-06-28 DIAGNOSIS — K210 Gastro-esophageal reflux disease with esophagitis: Secondary | ICD-10-CM | POA: Diagnosis not present

## 2018-07-03 DIAGNOSIS — E1151 Type 2 diabetes mellitus with diabetic peripheral angiopathy without gangrene: Secondary | ICD-10-CM | POA: Diagnosis not present

## 2018-07-03 DIAGNOSIS — E1140 Type 2 diabetes mellitus with diabetic neuropathy, unspecified: Secondary | ICD-10-CM | POA: Diagnosis not present

## 2018-07-05 ENCOUNTER — Other Ambulatory Visit (HOSPITAL_COMMUNITY): Payer: Self-pay | Admitting: Hematology

## 2018-07-05 DIAGNOSIS — Z853 Personal history of malignant neoplasm of breast: Secondary | ICD-10-CM

## 2018-07-13 ENCOUNTER — Ambulatory Visit (HOSPITAL_COMMUNITY)
Admission: RE | Admit: 2018-07-13 | Discharge: 2018-07-13 | Disposition: A | Discharge status: Home / Self Care | Payer: Medicare Other | Source: Ambulatory Visit | Attending: Hematology | Admitting: Hematology

## 2018-07-13 ENCOUNTER — Inpatient Hospital Stay (HOSPITAL_COMMUNITY): Payer: Medicare Other | Attending: Hematology

## 2018-07-13 DIAGNOSIS — R918 Other nonspecific abnormal finding of lung field: Secondary | ICD-10-CM | POA: Diagnosis not present

## 2018-07-13 DIAGNOSIS — R42 Dizziness and giddiness: Secondary | ICD-10-CM | POA: Diagnosis not present

## 2018-07-13 DIAGNOSIS — C50211 Malignant neoplasm of upper-inner quadrant of right female breast: Secondary | ICD-10-CM | POA: Insufficient documentation

## 2018-07-13 DIAGNOSIS — M810 Age-related osteoporosis without current pathological fracture: Secondary | ICD-10-CM | POA: Insufficient documentation

## 2018-07-13 DIAGNOSIS — Z79811 Long term (current) use of aromatase inhibitors: Secondary | ICD-10-CM | POA: Insufficient documentation

## 2018-07-13 DIAGNOSIS — N6320 Unspecified lump in the left breast, unspecified quadrant: Secondary | ICD-10-CM | POA: Diagnosis not present

## 2018-07-13 DIAGNOSIS — R531 Weakness: Secondary | ICD-10-CM | POA: Insufficient documentation

## 2018-07-13 DIAGNOSIS — R200 Anesthesia of skin: Secondary | ICD-10-CM | POA: Diagnosis not present

## 2018-07-13 DIAGNOSIS — I719 Aortic aneurysm of unspecified site, without rupture: Secondary | ICD-10-CM | POA: Insufficient documentation

## 2018-07-13 DIAGNOSIS — R911 Solitary pulmonary nodule: Secondary | ICD-10-CM

## 2018-07-13 DIAGNOSIS — Z170 Estrogen receptor positive status [ER+]: Secondary | ICD-10-CM | POA: Diagnosis not present

## 2018-07-13 DIAGNOSIS — Z5111 Encounter for antineoplastic chemotherapy: Secondary | ICD-10-CM | POA: Insufficient documentation

## 2018-07-13 DIAGNOSIS — Z7984 Long term (current) use of oral hypoglycemic drugs: Secondary | ICD-10-CM | POA: Diagnosis not present

## 2018-07-13 DIAGNOSIS — Z79899 Other long term (current) drug therapy: Secondary | ICD-10-CM | POA: Insufficient documentation

## 2018-07-13 DIAGNOSIS — I700 Atherosclerosis of aorta: Secondary | ICD-10-CM | POA: Diagnosis not present

## 2018-07-13 DIAGNOSIS — K8020 Calculus of gallbladder without cholecystitis without obstruction: Secondary | ICD-10-CM | POA: Insufficient documentation

## 2018-07-13 DIAGNOSIS — Z7982 Long term (current) use of aspirin: Secondary | ICD-10-CM | POA: Diagnosis not present

## 2018-07-13 DIAGNOSIS — E119 Type 2 diabetes mellitus without complications: Secondary | ICD-10-CM | POA: Diagnosis not present

## 2018-07-13 DIAGNOSIS — R530 Neoplastic (malignant) related fatigue: Secondary | ICD-10-CM | POA: Insufficient documentation

## 2018-07-13 LAB — COMPREHENSIVE METABOLIC PANEL
ALBUMIN: 3.6 g/dL (ref 3.5–5.0)
ALT: 11 U/L (ref 0–44)
AST: 21 U/L (ref 15–41)
Alkaline Phosphatase: 53 U/L (ref 38–126)
Anion gap: 9 (ref 5–15)
BUN: 15 mg/dL (ref 8–23)
CHLORIDE: 105 mmol/L (ref 98–111)
CO2: 29 mmol/L (ref 22–32)
Calcium: 9.5 mg/dL (ref 8.9–10.3)
Creatinine, Ser: 0.76 mg/dL (ref 0.44–1.00)
GFR calc Af Amer: >60 mL/min (ref >60)
GLUCOSE: 127 mg/dL — AB (ref 70–99)
POTASSIUM: 4.8 mmol/L (ref 3.5–5.1)
Sodium: 143 mmol/L (ref 135–145)
Total Bilirubin: 0.7 mg/dL (ref 0.3–1.2)
Total Protein: 7.3 g/dL (ref 6.5–8.1)

## 2018-07-13 LAB — CBC WITH DIFFERENTIAL/PLATELET
BASOS ABS: 0 10*3/uL (ref 0.0–0.1)
BASOS PCT: 0 %
EOS PCT: 8 %
Eosinophils Absolute: 0.4 10*3/uL (ref 0.0–0.7)
HEMATOCRIT: 35 % — AB (ref 36.0–46.0)
Hemoglobin: 10.9 g/dL — ABNORMAL LOW (ref 12.0–15.0)
Lymphocytes Relative: 23 %
Lymphs Abs: 1.1 10*3/uL (ref 0.7–4.0)
MCH: 28.8 pg (ref 26.0–34.0)
MCHC: 31.1 g/dL (ref 30.0–36.0)
MCV: 92.3 fL (ref 78.0–100.0)
MONO ABS: 0.5 10*3/uL (ref 0.1–1.0)
Monocytes Relative: 11 %
Neutro Abs: 2.6 10*3/uL (ref 1.7–7.7)
Neutrophils Relative %: 58 %
PLATELETS: 161 10*3/uL (ref 150–400)
RBC: 3.79 MIL/uL — ABNORMAL LOW (ref 3.87–5.11)
RDW: 15.1 % (ref 11.5–15.5)
WBC: 4.6 10*3/uL (ref 4.0–10.5)

## 2018-07-13 MED ORDER — IOPAMIDOL (ISOVUE-300) INJECTION 61%
75.0000 mL | Freq: Once | INTRAVENOUS | Status: AC | PRN
  Administered 2018-07-13: 75 mL via INTRAVENOUS

## 2018-07-14 LAB — CANCER ANTIGEN 15-3: CA 15-3: 41.8 U/mL — ABNORMAL HIGH (ref 0.0–25.0)

## 2018-07-17 ENCOUNTER — Other Ambulatory Visit (HOSPITAL_COMMUNITY): Payer: Medicare Other

## 2018-07-17 ENCOUNTER — Ambulatory Visit (HOSPITAL_COMMUNITY): Payer: Medicare Other

## 2018-07-18 ENCOUNTER — Other Ambulatory Visit (HOSPITAL_COMMUNITY): Payer: Medicare Other

## 2018-07-18 ENCOUNTER — Ambulatory Visit (HOSPITAL_COMMUNITY)
Admission: RE | Admit: 2018-07-18 | Discharge: 2018-07-18 | Disposition: A | Discharge status: Home / Self Care | Payer: Medicare Other | Source: Ambulatory Visit | Attending: Hematology | Admitting: Hematology

## 2018-07-18 ENCOUNTER — Encounter (HOSPITAL_COMMUNITY): Payer: Self-pay | Admitting: Hematology

## 2018-07-18 ENCOUNTER — Inpatient Hospital Stay (HOSPITAL_COMMUNITY): Payer: Medicare Other

## 2018-07-18 ENCOUNTER — Inpatient Hospital Stay (HOSPITAL_BASED_OUTPATIENT_CLINIC_OR_DEPARTMENT_OTHER): Payer: Medicare Other | Admitting: Hematology

## 2018-07-18 VITALS — BP 137/47 | HR 52 | Temp 97.9°F | Resp 14 | Wt 111.4 lb

## 2018-07-18 DIAGNOSIS — Z853 Personal history of malignant neoplasm of breast: Secondary | ICD-10-CM | POA: Diagnosis not present

## 2018-07-18 DIAGNOSIS — C50211 Malignant neoplasm of upper-inner quadrant of right female breast: Secondary | ICD-10-CM | POA: Insufficient documentation

## 2018-07-18 DIAGNOSIS — Z79899 Other long term (current) drug therapy: Secondary | ICD-10-CM

## 2018-07-18 DIAGNOSIS — M810 Age-related osteoporosis without current pathological fracture: Secondary | ICD-10-CM

## 2018-07-18 DIAGNOSIS — Z170 Estrogen receptor positive status [ER+]: Secondary | ICD-10-CM

## 2018-07-18 DIAGNOSIS — R531 Weakness: Secondary | ICD-10-CM

## 2018-07-18 DIAGNOSIS — E119 Type 2 diabetes mellitus without complications: Secondary | ICD-10-CM | POA: Diagnosis not present

## 2018-07-18 DIAGNOSIS — R200 Anesthesia of skin: Secondary | ICD-10-CM

## 2018-07-18 DIAGNOSIS — Z7982 Long term (current) use of aspirin: Secondary | ICD-10-CM | POA: Diagnosis not present

## 2018-07-18 DIAGNOSIS — Z7984 Long term (current) use of oral hypoglycemic drugs: Secondary | ICD-10-CM

## 2018-07-18 DIAGNOSIS — N6320 Unspecified lump in the left breast, unspecified quadrant: Secondary | ICD-10-CM | POA: Diagnosis not present

## 2018-07-18 DIAGNOSIS — Z79811 Long term (current) use of aromatase inhibitors: Secondary | ICD-10-CM

## 2018-07-18 DIAGNOSIS — R922 Inconclusive mammogram: Secondary | ICD-10-CM | POA: Diagnosis not present

## 2018-07-18 DIAGNOSIS — Z5111 Encounter for antineoplastic chemotherapy: Secondary | ICD-10-CM | POA: Diagnosis not present

## 2018-07-18 DIAGNOSIS — R42 Dizziness and giddiness: Secondary | ICD-10-CM

## 2018-07-18 DIAGNOSIS — R530 Neoplastic (malignant) related fatigue: Secondary | ICD-10-CM | POA: Diagnosis not present

## 2018-07-18 MED ORDER — DENOSUMAB 60 MG/ML ~~LOC~~ SOSY
60.0000 mg | PREFILLED_SYRINGE | Freq: Once | SUBCUTANEOUS | Status: AC
  Administered 2018-07-18: 60 mg via SUBCUTANEOUS
  Filled 2018-07-18: qty 1

## 2018-07-18 MED ORDER — FULVESTRANT 250 MG/5ML IM SOLN
500.0000 mg | Freq: Once | INTRAMUSCULAR | Status: AC
  Administered 2018-07-18: 500 mg via INTRAMUSCULAR

## 2018-07-18 NOTE — Progress Notes (Signed)
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 Bilateral cylinder shaped biopsy clips appear well positioned at the sites of the targeted masses. R breast mass at the 1:00 position, L breast mass at the 8:00 position    09/08/2016 Initial Biopsy    Ultrasound guided biopsy of bilateral breast masses    09/08/2016 Pathology Results    R breast invasive ductal carcinoma, low grade with focal micro-calcifications. 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 ER+ 90%, PR 90% HER 2 IHC 2+, FISH NEGATIVE    07/18/2018 -  Chemotherapy    The patient had fulvestrant (FASLODEX) injection 500 mg, 500 mg, Intramuscular,  Once, 1 of 7 cycles  for chemotherapy treatment.        INTERVAL HISTORY:  Kelly Fisher 84 y.o. female returns for routine follow-up right breast cancer. Patient has been on Anastrozole since her diagnosis October 2017. She refused surgery at that time. She is tolerating the medication very well. She has no complaints or side effects from the medication. Patient reports her lump in her breast feels like it has grown. She is has numbness in her hands and feet this is stable at this time. Patient has no appetite or energy. Patient denies any nausea, vomiting, or diarrhea. Denies any new pains. Denies any fevers or recent infections. Denies any rash or itching.    REVIEW OF SYSTEMS:  Review of Systems  Constitutional: Positive for fatigue.  Neurological: Positive for dizziness, extremity weakness and numbness.  All  other systems reviewed and are negative.    PAST MEDICAL/SURGICAL HISTORY:  Past Medical History:  Diagnosis Date  . CAD (coronary artery disease)   . High cholesterol   . Hypertension    Past Surgical History:  Procedure Laterality Date  . CORONARY ARTERY BYPASS GRAFT    . HIP FRACTURE SURGERY Left   . TOTAL KNEE ARTHROPLASTY Right      SOCIAL HISTORY:  Social History   Socioeconomic History  . Marital status: Married    Spouse name: Not on file  . Number of children: Not on file  . Years of education: Not on file  . Highest education level: Not on file  Occupational History  . Not on file  Social Needs  . Financial resource strain: Not on file  . Food insecurity:    Worry: Not on file    Inability: Not on file  . Transportation needs:    Medical: Not on file    Non-medical: Not on file  Tobacco Use  . Smoking status: Never Smoker  . Smokeless tobacco: Never Used  Substance and Sexual Activity  . Alcohol use: No  . Drug use: No  . Sexual activity: Not on file    Comment: married  Lifestyle  . Physical activity:    Days per week: Not on file    Minutes per session: Not on file  . Stress: Not on file  Relationships  . Social connections:    Talks on phone: Not on file    Gets together: Not on file  Attends religious service: Not on file    Active member of club or organization: Not on file    Attends meetings of clubs or organizations: Not on file    Relationship status: Not on file  . Intimate partner violence:    Fear of current or ex partner: Not on file    Emotionally abused: Not on file    Physically abused: Not on file    Forced sexual activity: Not on file  Other Topics Concern  . Not on file  Social History Narrative   07/18/18:   Been married 72 years. Husband is 52. They live together by themselves.    FAMILY HISTORY:  History reviewed. No pertinent family history.  CURRENT MEDICATIONS:  Outpatient Encounter Medications as of  07/18/2018  Medication Sig  . allopurinol (ZYLOPRIM) 300 MG tablet Take 300 mg by mouth daily.  Marland Kitchen anastrozole (ARIMIDEX) 1 MG tablet Take 1 mg by mouth daily.  Marland Kitchen aspirin EC 81 MG tablet Take 81 mg by mouth daily.  . calcium-vitamin D (OSCAL WITH D) 500-200 MG-UNIT tablet Take 2 tablets by mouth daily with breakfast.  . clopidogrel (PLAVIX) 75 MG tablet Take 75 mg by mouth daily.  . ferrous sulfate 325 (65 FE) MG tablet Take by mouth.  . lidocaine (LIDODERM) 5 % Apply patch to painful area. Patch may remain in place for up to 12 hours in a 24 hour period.  Marland Kitchen lisinopril (PRINIVIL,ZESTRIL) 10 MG tablet Take 10 mg by mouth daily.   . magnesium oxide (MAG-OX) 400 MG tablet Take by mouth.  . Melatonin 3 MG TABS Take by mouth.  . metaxalone (SKELAXIN) 800 MG tablet TAKE 1 TABLET BY MOUTH 3 TIMES A DAY AS NEEDED FOR MUSCLE SPASM  . metFORMIN (GLUCOPHAGE) 500 MG tablet Take 1,000 mg by mouth 2 (two) times daily.  . metoprolol tartrate (LOPRESSOR) 25 MG tablet Take 25 mg by mouth daily.  Marland Kitchen oxyCODONE-acetaminophen (PERCOCET/ROXICET) 5-325 MG tablet Take 1 tablet by mouth every 6 (six) hours as needed. for pain  . pantoprazole (PROTONIX) 40 MG tablet Take 40 mg by mouth daily.  . polyethylene glycol (MIRALAX / GLYCOLAX) packet Take 17 g by mouth.  . simvastatin (ZOCOR) 40 MG tablet Take 40 mg by mouth.  . [EXPIRED] denosumab (PROLIA) injection 60 mg    No facility-administered encounter medications on file as of 07/18/2018.     ALLERGIES:  No Known Allergies   PHYSICAL EXAM:  ECOG Performance status: 2  Vitals:   07/18/18 1338  BP: (!) 137/47  Pulse: (!) 52  Resp: 14  Temp: 97.9 F (36.6 C)  SpO2: 100%   Filed Weights   07/18/18 1338  Weight: 111 lb 6.4 oz (50.5 kg)    Physical Exam  Constitutional: She is oriented to person, place, and time.  Cardiovascular: Normal rate.  Murmur heard. Pulmonary/Chest: Effort normal. She has wheezes.  Neurological: She is alert and oriented to  person, place, and time.  Skin: Skin is warm and dry.     LABORATORY DATA:  I have reviewed the labs as listed.  CBC    Component Value Date/Time   WBC 4.6 07/13/2018 1130   RBC 3.79 (L) 07/13/2018 1130   HGB 10.9 (L) 07/13/2018 1130   HCT 35.0 (L) 07/13/2018 1130   PLT 161 07/13/2018 1130   MCV 92.3 07/13/2018 1130   MCH 28.8 07/13/2018 1130   MCHC 31.1 07/13/2018 1130   RDW 15.1 07/13/2018 1130   LYMPHSABS 1.1 07/13/2018 1130  MONOABS 0.5 07/13/2018 1130   EOSABS 0.4 07/13/2018 1130   BASOSABS 0.0 07/13/2018 1130   CMP Latest Ref Rng & Units 07/13/2018 01/16/2018 07/15/2017  Glucose 70 - 99 mg/dL 127(H) 117(H) 110(H)  BUN 8 - 23 mg/dL _0 Creatinine 0.44 - 1.00 mg/dL 0.76 0.88 0.73  Sodium 135 - 145 mmol/L 143 139 138  Potassium 3.5 - 5.1 mmol/L 4.8 3.9 4.4  Chloride 98 - 111 mmol/L 105 103 104  CO2 22 - 32 mmol/L _1 Calcium 8.9 - 10.3 mg/dL 9.5 8.8(L) 9.3  Total Protein 6.5 - 8.1 g/dL 7.3 7.1 7.1  Total Bilirubin 0.3 - 1.2 mg/dL 0.7 0.6 0.5  Alkaline Phos 38 - 126 U/L 53 55 52  AST 15 - 41 U/L _2 ALT 0 - 44 U/L 11 10(L) 15       DIAGNOSTIC IMAGING:  I have independently reviewed images of the CT scan of the chest dated 07/13/2018 on ultrasound.     ASSESSMENT & PLAN:   Malignant neoplasm of upper-inner quadrant of right breast in female, estrogen receptor positive ([place]) 1.  Right breast infiltrating ductal carcinoma, ER/PR positive and HER-2 negative: -Diagnosed on 09/08/2016 by biopsy, surgery was refused, anastrozole started - Ultrasound of the right breast in September 2018 showed improvement in size, ultrasound in February 2019 showed more or less stable disease. - She is taking anastrozole without missing doses. -Today's physical examination revealed mass increased to 3 cm in size.  I have also reviewed the reports of the ultrasound dated 07/18/2018 which shows 2.8 x 1.6 x 2.7 cm mass, previously 2.4 x 1 x 1.8 cm. - I have recommended  change in therapy at this time.  Will discontinue anastrozole.  We will start her on Faslodex with loading dose.  We discussed the side effects in detail.  She will proceed with the first dose today.  She will be seen back in 3 months for follow-up.  2.  Osteoporosis: -She will continue Prolia every 6 months.  She will receive her dose today.  She is tolerating it very well.  Calcium was within normal limits.  3.  Right middle lobe lung nodule: -On PET scan in November 2017 has shown slight growth since 2014.  This was hypermetabolic. -I reviewed the results of the CT scan of the chest dated 07/13/2018 which shows right lung nodules unchanged from 02/04/2011.       Orders placed this encounter:  Orders Placed This Encounter  Procedures  . Cancer antigen 15-3  . CBC with Differential/Platelet  . Comprehensive metabolic panel      Derek Jack, MD [place] [phone]

## 2018-07-18 NOTE — Assessment & Plan Note (Signed)
1.  Right breast infiltrating ductal carcinoma, ER/PR positive and HER-2 negative: -Diagnosed on 09/08/2016 by biopsy, surgery was refused, anastrozole started - Ultrasound of the right breast in September 2018 showed improvement in size, ultrasound in February 2019 showed more or less stable disease. - She is taking anastrozole without missing doses. -Today's physical examination revealed mass increased to 3 cm in size.  I have also reviewed the reports of the ultrasound dated 07/18/2018 which shows 2.8 x 1.6 x 2.7 cm mass, previously 2.4 x 1 x 1.8 cm. - I have recommended change in therapy at this time.  Will discontinue anastrozole.  We will start her on Faslodex with loading dose.  We discussed the side effects in detail.  She will proceed with the first dose today.  She will be seen back in 3 months for follow-up.  2.  Osteoporosis: -She will continue Prolia every 6 months.  She will receive her dose today.  She is tolerating it very well.  Calcium was within normal limits.  3.  Right middle lobe lung nodule: -On PET scan in November 2017 has shown slight growth since 2014.  This was hypermetabolic. -I reviewed the results of the CT scan of the chest dated 07/13/2018 which shows right lung nodules unchanged from 02/04/2011.

## 2018-07-18 NOTE — Progress Notes (Addendum)
Patient tolerated prolia shot with no complaints voiced.  Injection site clean and dry with no bruising or swelling noted at site.  Band aid applied.  VSS with discharge and left with family with no s/s of distress noted.

## 2018-07-18 NOTE — Patient Instructions (Addendum)
[facility] at [facility] Discharge Instructions  You saw Dr. K today.   Thank you for choosing [facility] at [facility] to provide your oncology and hematology care.  To afford each patient quality time with our provider, please arrive at least 15 minutes before your scheduled appointment time.   If you have a lab appointment with the Cancer Center please come in thru the  Main Entrance and check in at the main information desk  You need to re-schedule your appointment should you arrive 10 or more minutes late.  We strive to give you quality time with our providers, and arriving late affects you and other patients whose appointments are after yours.  Also, if you no show three or more times for appointments you may be dismissed from the clinic at the providers discretion.     Again, thank you for choosing [facility].  Our hope is that these requests will decrease the amount of time that you wait before being seen by our physicians.       _____________________________________________________________  Should you have questions after your visit to [facility], please contact our office at [phone] between the hours of 8:00 a.m. and 4:30 p.m.  Voicemails left after 4:00 p.m. will not be returned until the following business day.  For prescription refill requests, have your pharmacy contact our office and allow 72 hours.    Cancer Center Support Programs:   > Cancer Support Group  2nd Tuesday of the month 1pm-2pm, Journey Room    

## 2018-07-18 NOTE — Patient Instructions (Signed)
[facility] at [facility]  Discharge Instructions:  You received a b12 shot today.  _______________________________________________________________  Thank you for choosing [facility] at [facility] to provide your oncology and hematology care.  To afford each patient quality time with our providers, please arrive at least 15 minutes before your scheduled appointment.  You need to re-schedule your appointment if you arrive 10 or more minutes late.  We strive to give you quality time with our providers, and arriving late affects you and other patients whose appointments are after yours.  Also, if you no show three or more times for appointments you may be dismissed from the clinic.  Again, thank you for choosing [facility]. Our hope is that these requests will allow you access to exceptional care and in a timely manner. _______________________________________________________________  If you have questions after your visit, please contact our office at [phone] between the hours of 8:30 a.m. and 5:00 p.m. Voicemails left after 4:30 p.m. will not be returned until the following business day. _______________________________________________________________  For prescription refill requests, have your pharmacy contact our office. _______________________________________________________________  Recommendations made by the consultant and any test results will be sent to your referring physician. _______________________________________________________________ 

## 2018-07-18 NOTE — Progress Notes (Signed)
Patient tolerated faslodex shots with no complaints voiced.  Injection sites clean and dry with no bruising or swelling noted at site.  Band aids applied.  Patient left with family with no s/s of distress noted.

## 2018-07-18 NOTE — Addendum Note (Signed)
Addended by: Charlyne Petrin B on: 07/18/2018 02:11 PM   Modules accepted: Orders

## 2018-07-25 ENCOUNTER — Ambulatory Visit (HOSPITAL_COMMUNITY): Payer: Medicare Other

## 2018-08-01 ENCOUNTER — Inpatient Hospital Stay (HOSPITAL_COMMUNITY): Payer: Medicare Other | Attending: Hematology

## 2018-08-01 VITALS — BP 121/64 | HR 56 | Temp 97.6°F | Resp 16

## 2018-08-01 DIAGNOSIS — C50211 Malignant neoplasm of upper-inner quadrant of right female breast: Secondary | ICD-10-CM | POA: Insufficient documentation

## 2018-08-01 DIAGNOSIS — Z170 Estrogen receptor positive status [ER+]: Secondary | ICD-10-CM | POA: Insufficient documentation

## 2018-08-01 DIAGNOSIS — Z5111 Encounter for antineoplastic chemotherapy: Secondary | ICD-10-CM | POA: Insufficient documentation

## 2018-08-01 MED ORDER — FULVESTRANT 250 MG/5ML IM SOLN
500.0000 mg | Freq: Once | INTRAMUSCULAR | Status: AC
  Administered 2018-08-01: 500 mg via INTRAMUSCULAR

## 2018-08-01 NOTE — Progress Notes (Signed)
Van Clines Hazle Quant tolerated Faslodex injections without incident or complaint. No bruising or swelling noted at site. VSS. Discharged via wheelchair in company of family member.

## 2018-08-01 NOTE — Patient Instructions (Signed)
[place] at [facility]  Discharge Instructions:  Today you received Faaslodex injections. Follow up as scheduled. Call clinic for any questions or concerns. _______________________________________________________________  Thank you for choosing [place] at [facility] to provide your oncology and hematology care.  To afford each patient quality time with our providers, please arrive at least 15 minutes before your scheduled appointment.  You need to re-schedule your appointment if you arrive 10 or more minutes late.  We strive to give you quality time with our providers, and arriving late affects you and other patients whose appointments are after yours.  Also, if you no show three or more times for appointments you may be dismissed from the clinic.  Again, thank you for choosing [place] at [place] hope is that these requests will allow you access to exceptional care and in a timely manner. _______________________________________________________________  If you have questions after your visit, please contact our office at [phone] between the hours of 8:30 a.m. and 5:00 p.m. Voicemails left after 4:30 p.m. will not be returned until the following business day. _______________________________________________________________  For prescription refill requests, have your pharmacy contact our office. _______________________________________________________________  Recommendations made by the consultant and any test results will be sent to your referring physician. _______________________________________________________________

## 2018-08-10 DIAGNOSIS — E1165 Type 2 diabetes mellitus with hyperglycemia: Secondary | ICD-10-CM | POA: Diagnosis not present

## 2018-08-10 DIAGNOSIS — I10 Essential (primary) hypertension: Secondary | ICD-10-CM | POA: Diagnosis not present

## 2018-08-10 DIAGNOSIS — Z0000 Encounter for general adult medical examination without abnormal findings: Secondary | ICD-10-CM | POA: Diagnosis not present

## 2018-08-10 DIAGNOSIS — M10242 Drug-induced gout, left hand: Secondary | ICD-10-CM | POA: Diagnosis not present

## 2018-08-10 DIAGNOSIS — K210 Gastro-esophageal reflux disease with esophagitis: Secondary | ICD-10-CM | POA: Diagnosis not present

## 2018-08-10 DIAGNOSIS — Z1389 Encounter for screening for other disorder: Secondary | ICD-10-CM | POA: Diagnosis not present

## 2018-08-15 ENCOUNTER — Encounter (HOSPITAL_COMMUNITY): Payer: Self-pay

## 2018-08-15 ENCOUNTER — Inpatient Hospital Stay (HOSPITAL_COMMUNITY): Payer: Medicare Other

## 2018-08-15 VITALS — BP 134/49 | HR 54 | Temp 98.2°F | Resp 16

## 2018-08-15 DIAGNOSIS — C50211 Malignant neoplasm of upper-inner quadrant of right female breast: Secondary | ICD-10-CM

## 2018-08-15 DIAGNOSIS — Z5111 Encounter for antineoplastic chemotherapy: Secondary | ICD-10-CM | POA: Diagnosis not present

## 2018-08-15 DIAGNOSIS — Z170 Estrogen receptor positive status [ER+]: Principal | ICD-10-CM

## 2018-08-15 MED ORDER — FULVESTRANT 250 MG/5ML IM SOLN
500.0000 mg | Freq: Once | INTRAMUSCULAR | Status: AC
  Administered 2018-08-15: 500 mg via INTRAMUSCULAR

## 2018-08-15 NOTE — Patient Instructions (Signed)
[facility] at [facility] Discharge Instructions  Received Faslodex injection today. Follow-up as scheduled. Call clinic for any questions or concerns   Thank you for choosing [facility] at [facility] to provide your oncology and hematology care.  To afford each patient quality time with our provider, please arrive at least 15 minutes before your scheduled appointment time.   If you have a lab appointment with the Cancer Center please come in thru the  Main Entrance and check in at the main information desk  You need to re-schedule your appointment should you arrive 10 or more minutes late.  We strive to give you quality time with our providers, and arriving late affects you and other patients whose appointments are after yours.  Also, if you no show three or more times for appointments you may be dismissed from the clinic at the providers discretion.     Again, thank you for choosing [facility].  Our hope is that these requests will decrease the amount of time that you wait before being seen by our physicians.       _____________________________________________________________  Should you have questions after your visit to [facility], please contact our office at [phone] between the hours of 8:00 a.m. and 4:30 p.m.  Voicemails left after 4:00 p.m. will not be returned until the following business day.  For prescription refill requests, have your pharmacy contact our office and allow 72 hours.    Cancer Center Support Programs:   > Cancer Support Group  2nd Tuesday of the month 1pm-2pm, Journey Room   

## 2018-08-15 NOTE — Progress Notes (Signed)
Kelly Fisher tolerated Faslodex injection well without complaints or incident. VSS Pt discharged via wheelchair in satisfactory condition accompanied by a family member

## 2018-09-12 ENCOUNTER — Other Ambulatory Visit: Payer: Self-pay

## 2018-09-12 ENCOUNTER — Encounter (HOSPITAL_COMMUNITY): Payer: Self-pay

## 2018-09-12 ENCOUNTER — Inpatient Hospital Stay (HOSPITAL_COMMUNITY): Payer: Medicare Other | Attending: Hematology

## 2018-09-12 VITALS — BP 103/59 | HR 68 | Resp 16

## 2018-09-12 DIAGNOSIS — C50211 Malignant neoplasm of upper-inner quadrant of right female breast: Secondary | ICD-10-CM | POA: Insufficient documentation

## 2018-09-12 DIAGNOSIS — Z5111 Encounter for antineoplastic chemotherapy: Secondary | ICD-10-CM | POA: Insufficient documentation

## 2018-09-12 DIAGNOSIS — Z170 Estrogen receptor positive status [ER+]: Secondary | ICD-10-CM | POA: Diagnosis not present

## 2018-09-12 MED ORDER — FULVESTRANT 250 MG/5ML IM SOLN
500.0000 mg | Freq: Once | INTRAMUSCULAR | Status: AC
  Administered 2018-09-12: 500 mg via INTRAMUSCULAR

## 2018-09-12 NOTE — Progress Notes (Signed)
Injection given per orders. See MAR.  Patient tolerated it well without problems. Vitals stable and discharged home from clinic via wheelchair. Follow up as scheduled.

## 2018-09-12 NOTE — Patient Instructions (Signed)
[facility] at [facility] Discharge Instructions     Thank you for choosing [facility] at [facility] to provide your oncology and hematology care.  To afford each patient quality time with our provider, please arrive at least 15 minutes before your scheduled appointment time.   If you have a lab appointment with the Cancer Center please come in thru the  Main Entrance and check in at the main information desk  You need to re-schedule your appointment should you arrive 10 or more minutes late.  We strive to give you quality time with our providers, and arriving late affects you and other patients whose appointments are after yours.  Also, if you no show three or more times for appointments you may be dismissed from the clinic at the providers discretion.     Again, thank you for choosing [facility].  Our hope is that these requests will decrease the amount of time that you wait before being seen by our physicians.       _____________________________________________________________  Should you have questions after your visit to [facility], please contact our office at [phone] between the hours of 8:00 a.m. and 4:30 p.m.  Voicemails left after 4:00 p.m. will not be returned until the following business day.  For prescription refill requests, have your pharmacy contact our office and allow 72 hours.    Cancer Center Support Programs:   > Cancer Support Group  2nd Tuesday of the month 1pm-2pm, Journey Room    

## 2018-09-15 DIAGNOSIS — K210 Gastro-esophageal reflux disease with esophagitis: Secondary | ICD-10-CM | POA: Diagnosis not present

## 2018-09-15 DIAGNOSIS — I10 Essential (primary) hypertension: Secondary | ICD-10-CM | POA: Diagnosis not present

## 2018-09-15 DIAGNOSIS — E1165 Type 2 diabetes mellitus with hyperglycemia: Secondary | ICD-10-CM | POA: Diagnosis not present

## 2018-09-21 DIAGNOSIS — I10 Essential (primary) hypertension: Secondary | ICD-10-CM | POA: Diagnosis not present

## 2018-09-21 DIAGNOSIS — R52 Pain, unspecified: Secondary | ICD-10-CM | POA: Diagnosis not present

## 2018-09-21 DIAGNOSIS — S20219A Contusion of unspecified front wall of thorax, initial encounter: Secondary | ICD-10-CM | POA: Diagnosis not present

## 2018-09-21 DIAGNOSIS — S299XXA Unspecified injury of thorax, initial encounter: Secondary | ICD-10-CM | POA: Diagnosis not present

## 2018-09-21 DIAGNOSIS — S3993XA Unspecified injury of pelvis, initial encounter: Secondary | ICD-10-CM | POA: Diagnosis not present

## 2018-09-21 DIAGNOSIS — R102 Pelvic and perineal pain: Secondary | ICD-10-CM | POA: Diagnosis not present

## 2018-09-21 DIAGNOSIS — R079 Chest pain, unspecified: Secondary | ICD-10-CM | POA: Diagnosis not present

## 2018-09-25 DIAGNOSIS — E1140 Type 2 diabetes mellitus with diabetic neuropathy, unspecified: Secondary | ICD-10-CM | POA: Diagnosis not present

## 2018-09-25 DIAGNOSIS — E1151 Type 2 diabetes mellitus with diabetic peripheral angiopathy without gangrene: Secondary | ICD-10-CM | POA: Diagnosis not present

## 2018-10-02 DIAGNOSIS — Z951 Presence of aortocoronary bypass graft: Secondary | ICD-10-CM | POA: Diagnosis not present

## 2018-10-02 DIAGNOSIS — B9620 Unspecified Escherichia coli [E. coli] as the cause of diseases classified elsewhere: Secondary | ICD-10-CM | POA: Diagnosis not present

## 2018-10-02 DIAGNOSIS — Z9181 History of falling: Secondary | ICD-10-CM | POA: Diagnosis not present

## 2018-10-02 DIAGNOSIS — S7002XD Contusion of left hip, subsequent encounter: Secondary | ICD-10-CM | POA: Diagnosis not present

## 2018-10-02 DIAGNOSIS — R079 Chest pain, unspecified: Secondary | ICD-10-CM | POA: Diagnosis not present

## 2018-10-02 DIAGNOSIS — E441 Mild protein-calorie malnutrition: Secondary | ICD-10-CM | POA: Diagnosis not present

## 2018-10-02 DIAGNOSIS — W06XXXA Fall from bed, initial encounter: Secondary | ICD-10-CM | POA: Diagnosis not present

## 2018-10-02 DIAGNOSIS — R531 Weakness: Secondary | ICD-10-CM | POA: Diagnosis not present

## 2018-10-02 DIAGNOSIS — I5032 Chronic diastolic (congestive) heart failure: Secondary | ICD-10-CM | POA: Diagnosis not present

## 2018-10-02 DIAGNOSIS — S20212A Contusion of left front wall of thorax, initial encounter: Secondary | ICD-10-CM | POA: Diagnosis present

## 2018-10-02 DIAGNOSIS — R296 Repeated falls: Secondary | ICD-10-CM | POA: Diagnosis not present

## 2018-10-02 DIAGNOSIS — R2689 Other abnormalities of gait and mobility: Secondary | ICD-10-CM | POA: Diagnosis not present

## 2018-10-02 DIAGNOSIS — N390 Urinary tract infection, site not specified: Secondary | ICD-10-CM | POA: Diagnosis not present

## 2018-10-02 DIAGNOSIS — S299XXA Unspecified injury of thorax, initial encounter: Secondary | ICD-10-CM | POA: Diagnosis not present

## 2018-10-02 DIAGNOSIS — S20219D Contusion of unspecified front wall of thorax, subsequent encounter: Secondary | ICD-10-CM | POA: Diagnosis not present

## 2018-10-02 DIAGNOSIS — Z952 Presence of prosthetic heart valve: Secondary | ICD-10-CM | POA: Diagnosis not present

## 2018-10-02 DIAGNOSIS — S7001XD Contusion of right hip, subsequent encounter: Secondary | ICD-10-CM | POA: Diagnosis not present

## 2018-10-02 DIAGNOSIS — E785 Hyperlipidemia, unspecified: Secondary | ICD-10-CM | POA: Diagnosis present

## 2018-10-02 DIAGNOSIS — Z681 Body mass index (BMI) 19 or less, adult: Secondary | ICD-10-CM | POA: Diagnosis not present

## 2018-10-02 DIAGNOSIS — K449 Diaphragmatic hernia without obstruction or gangrene: Secondary | ICD-10-CM | POA: Diagnosis present

## 2018-10-02 DIAGNOSIS — C50911 Malignant neoplasm of unspecified site of right female breast: Secondary | ICD-10-CM | POA: Diagnosis present

## 2018-10-02 DIAGNOSIS — Z7984 Long term (current) use of oral hypoglycemic drugs: Secondary | ICD-10-CM | POA: Diagnosis not present

## 2018-10-02 DIAGNOSIS — R4182 Altered mental status, unspecified: Secondary | ICD-10-CM | POA: Diagnosis not present

## 2018-10-02 DIAGNOSIS — I2510 Atherosclerotic heart disease of native coronary artery without angina pectoris: Secondary | ICD-10-CM | POA: Diagnosis present

## 2018-10-02 DIAGNOSIS — Z79899 Other long term (current) drug therapy: Secondary | ICD-10-CM | POA: Diagnosis not present

## 2018-10-02 DIAGNOSIS — M6281 Muscle weakness (generalized): Secondary | ICD-10-CM | POA: Diagnosis not present

## 2018-10-02 DIAGNOSIS — S7001XA Contusion of right hip, initial encounter: Secondary | ICD-10-CM | POA: Diagnosis present

## 2018-10-02 DIAGNOSIS — E119 Type 2 diabetes mellitus without complications: Secondary | ICD-10-CM | POA: Diagnosis present

## 2018-10-02 DIAGNOSIS — R41841 Cognitive communication deficit: Secondary | ICD-10-CM | POA: Diagnosis not present

## 2018-10-02 DIAGNOSIS — I110 Hypertensive heart disease with heart failure: Secondary | ICD-10-CM | POA: Diagnosis present

## 2018-10-02 DIAGNOSIS — S7002XA Contusion of left hip, initial encounter: Secondary | ICD-10-CM | POA: Diagnosis present

## 2018-10-02 DIAGNOSIS — Z7982 Long term (current) use of aspirin: Secondary | ICD-10-CM | POA: Diagnosis not present

## 2018-10-04 ENCOUNTER — Other Ambulatory Visit (HOSPITAL_COMMUNITY): Payer: Medicare Other

## 2018-10-06 DIAGNOSIS — E7849 Other hyperlipidemia: Secondary | ICD-10-CM | POA: Diagnosis not present

## 2018-10-06 DIAGNOSIS — Z7982 Long term (current) use of aspirin: Secondary | ICD-10-CM | POA: Diagnosis not present

## 2018-10-06 DIAGNOSIS — Z681 Body mass index (BMI) 19 or less, adult: Secondary | ICD-10-CM | POA: Diagnosis not present

## 2018-10-06 DIAGNOSIS — E441 Mild protein-calorie malnutrition: Secondary | ICD-10-CM | POA: Diagnosis not present

## 2018-10-06 DIAGNOSIS — R531 Weakness: Secondary | ICD-10-CM | POA: Diagnosis not present

## 2018-10-06 DIAGNOSIS — R51 Headache: Secondary | ICD-10-CM | POA: Diagnosis not present

## 2018-10-06 DIAGNOSIS — Z7984 Long term (current) use of oral hypoglycemic drugs: Secondary | ICD-10-CM | POA: Diagnosis not present

## 2018-10-06 DIAGNOSIS — B9620 Unspecified Escherichia coli [E. coli] as the cause of diseases classified elsewhere: Secondary | ICD-10-CM | POA: Diagnosis not present

## 2018-10-06 DIAGNOSIS — M810 Age-related osteoporosis without current pathological fracture: Secondary | ICD-10-CM | POA: Diagnosis not present

## 2018-10-06 DIAGNOSIS — R4182 Altered mental status, unspecified: Secondary | ICD-10-CM | POA: Diagnosis not present

## 2018-10-06 DIAGNOSIS — S20219D Contusion of unspecified front wall of thorax, subsequent encounter: Secondary | ICD-10-CM | POA: Diagnosis not present

## 2018-10-06 DIAGNOSIS — R41841 Cognitive communication deficit: Secondary | ICD-10-CM | POA: Diagnosis not present

## 2018-10-06 DIAGNOSIS — Z993 Dependence on wheelchair: Secondary | ICD-10-CM | POA: Diagnosis not present

## 2018-10-06 DIAGNOSIS — I110 Hypertensive heart disease with heart failure: Secondary | ICD-10-CM | POA: Diagnosis not present

## 2018-10-06 DIAGNOSIS — S7002XD Contusion of left hip, subsequent encounter: Secondary | ICD-10-CM | POA: Diagnosis not present

## 2018-10-06 DIAGNOSIS — Z79899 Other long term (current) drug therapy: Secondary | ICD-10-CM | POA: Diagnosis not present

## 2018-10-06 DIAGNOSIS — C50211 Malignant neoplasm of upper-inner quadrant of right female breast: Secondary | ICD-10-CM | POA: Diagnosis present

## 2018-10-06 DIAGNOSIS — N390 Urinary tract infection, site not specified: Secondary | ICD-10-CM | POA: Diagnosis not present

## 2018-10-06 DIAGNOSIS — R296 Repeated falls: Secondary | ICD-10-CM | POA: Diagnosis not present

## 2018-10-06 DIAGNOSIS — I5032 Chronic diastolic (congestive) heart failure: Secondary | ICD-10-CM | POA: Diagnosis not present

## 2018-10-06 DIAGNOSIS — Z5111 Encounter for antineoplastic chemotherapy: Secondary | ICD-10-CM | POA: Diagnosis not present

## 2018-10-06 DIAGNOSIS — I5021 Acute systolic (congestive) heart failure: Secondary | ICD-10-CM | POA: Diagnosis not present

## 2018-10-06 DIAGNOSIS — E119 Type 2 diabetes mellitus without complications: Secondary | ICD-10-CM | POA: Diagnosis not present

## 2018-10-06 DIAGNOSIS — R2689 Other abnormalities of gait and mobility: Secondary | ICD-10-CM | POA: Diagnosis not present

## 2018-10-06 DIAGNOSIS — K5900 Constipation, unspecified: Secondary | ICD-10-CM | POA: Diagnosis not present

## 2018-10-06 DIAGNOSIS — S7001XD Contusion of right hip, subsequent encounter: Secondary | ICD-10-CM | POA: Diagnosis not present

## 2018-10-06 DIAGNOSIS — Z170 Estrogen receptor positive status [ER+]: Secondary | ICD-10-CM | POA: Diagnosis not present

## 2018-10-06 DIAGNOSIS — M6281 Muscle weakness (generalized): Secondary | ICD-10-CM | POA: Diagnosis not present

## 2018-10-07 DIAGNOSIS — E119 Type 2 diabetes mellitus without complications: Secondary | ICD-10-CM | POA: Diagnosis not present

## 2018-10-07 DIAGNOSIS — R531 Weakness: Secondary | ICD-10-CM | POA: Diagnosis not present

## 2018-10-07 DIAGNOSIS — I5021 Acute systolic (congestive) heart failure: Secondary | ICD-10-CM | POA: Diagnosis not present

## 2018-10-07 DIAGNOSIS — E7849 Other hyperlipidemia: Secondary | ICD-10-CM | POA: Diagnosis not present

## 2018-10-11 ENCOUNTER — Inpatient Hospital Stay (HOSPITAL_COMMUNITY): Payer: Medicare Other

## 2018-10-11 ENCOUNTER — Encounter (HOSPITAL_COMMUNITY): Payer: Self-pay

## 2018-10-11 ENCOUNTER — Inpatient Hospital Stay (HOSPITAL_COMMUNITY): Payer: Medicare Other | Admitting: Hematology

## 2018-10-11 ENCOUNTER — Inpatient Hospital Stay (HOSPITAL_COMMUNITY): Payer: Medicare Other | Attending: Hematology

## 2018-10-11 VITALS — BP 124/58 | HR 61 | Temp 98.7°F | Resp 16

## 2018-10-11 DIAGNOSIS — Z5111 Encounter for antineoplastic chemotherapy: Secondary | ICD-10-CM | POA: Diagnosis not present

## 2018-10-11 DIAGNOSIS — K5900 Constipation, unspecified: Secondary | ICD-10-CM | POA: Diagnosis not present

## 2018-10-11 DIAGNOSIS — C50211 Malignant neoplasm of upper-inner quadrant of right female breast: Secondary | ICD-10-CM | POA: Insufficient documentation

## 2018-10-11 DIAGNOSIS — M810 Age-related osteoporosis without current pathological fracture: Secondary | ICD-10-CM | POA: Diagnosis not present

## 2018-10-11 DIAGNOSIS — R51 Headache: Secondary | ICD-10-CM | POA: Diagnosis not present

## 2018-10-11 DIAGNOSIS — Z7984 Long term (current) use of oral hypoglycemic drugs: Secondary | ICD-10-CM | POA: Insufficient documentation

## 2018-10-11 DIAGNOSIS — Z993 Dependence on wheelchair: Secondary | ICD-10-CM | POA: Insufficient documentation

## 2018-10-11 DIAGNOSIS — Z170 Estrogen receptor positive status [ER+]: Principal | ICD-10-CM

## 2018-10-11 DIAGNOSIS — Z79899 Other long term (current) drug therapy: Secondary | ICD-10-CM | POA: Insufficient documentation

## 2018-10-11 DIAGNOSIS — R531 Weakness: Secondary | ICD-10-CM | POA: Insufficient documentation

## 2018-10-11 DIAGNOSIS — Z7982 Long term (current) use of aspirin: Secondary | ICD-10-CM | POA: Insufficient documentation

## 2018-10-11 DIAGNOSIS — E119 Type 2 diabetes mellitus without complications: Secondary | ICD-10-CM | POA: Insufficient documentation

## 2018-10-11 LAB — COMPREHENSIVE METABOLIC PANEL
ALBUMIN: 3.2 g/dL — AB (ref 3.5–5.0)
ALK PHOS: 53 U/L (ref 38–126)
ALT: 13 U/L (ref 0–44)
ANION GAP: 7 (ref 5–15)
AST: 17 U/L (ref 15–41)
BILIRUBIN TOTAL: 0.7 mg/dL (ref 0.3–1.2)
BUN: 15 mg/dL (ref 8–23)
CALCIUM: 8.8 mg/dL — AB (ref 8.9–10.3)
CO2: 25 mmol/L (ref 22–32)
Chloride: 107 mmol/L (ref 98–111)
Creatinine, Ser: 0.44 mg/dL (ref 0.44–1.00)
GLUCOSE: 118 mg/dL — AB (ref 70–99)
POTASSIUM: 4.1 mmol/L (ref 3.5–5.1)
Sodium: 139 mmol/L (ref 135–145)
Total Protein: 7 g/dL (ref 6.5–8.1)

## 2018-10-11 LAB — CBC WITH DIFFERENTIAL/PLATELET
Abs Immature Granulocytes: 0.01 10*3/uL (ref 0.00–0.07)
BASOS PCT: 1 %
Basophils Absolute: 0 10*3/uL (ref 0.0–0.1)
EOS ABS: 0.2 10*3/uL (ref 0.0–0.5)
EOS PCT: 4 %
HEMATOCRIT: 35.4 % — AB (ref 36.0–46.0)
Hemoglobin: 10.9 g/dL — ABNORMAL LOW (ref 12.0–15.0)
IMMATURE GRANULOCYTES: 0 %
LYMPHS ABS: 0.9 10*3/uL (ref 0.7–4.0)
Lymphocytes Relative: 17 %
MCH: 29.6 pg (ref 26.0–34.0)
MCHC: 30.8 g/dL (ref 30.0–36.0)
MCV: 96.2 fL (ref 80.0–100.0)
Monocytes Absolute: 0.8 10*3/uL (ref 0.1–1.0)
Monocytes Relative: 15 %
NEUTROS PCT: 63 %
NRBC: 0 % (ref 0.0–0.2)
Neutro Abs: 3.4 10*3/uL (ref 1.7–7.7)
Platelets: 168 10*3/uL (ref 150–400)
RBC: 3.68 MIL/uL — ABNORMAL LOW (ref 3.87–5.11)
RDW: 14.1 % (ref 11.5–15.5)
WBC: 5.3 10*3/uL (ref 4.0–10.5)

## 2018-10-11 MED ORDER — FULVESTRANT 250 MG/5ML IM SOLN
500.0000 mg | Freq: Once | INTRAMUSCULAR | Status: AC
  Administered 2018-10-11: 500 mg via INTRAMUSCULAR

## 2018-10-11 NOTE — Patient Instructions (Signed)
[facility]  Discharge Instructions:   _______________________________________________________________  Thank you for choosing [facility] at [facility] to provide your oncology and hematology care.  To afford each patient quality time with our providers, please arrive at least 15 minutes before your scheduled appointment.  You need to re-schedule your appointment if you arrive 10 or more minutes late.  We strive to give you quality time with our providers, and arriving late affects you and other patients whose appointments are after yours.  Also, if you no show three or more times for appointments you may be dismissed from the clinic.  Again, thank you for choosing [facility] at [facility]. Our hope is that these requests will allow you access to exceptional care and in a timely manner. _______________________________________________________________  If you have questions after your visit, please contact our office at [phone] between the hours of 8:30 a.m. and 5:00 p.m. Voicemails left after 4:30 p.m. will not be returned until the following business day. _______________________________________________________________  For prescription refill requests, have your pharmacy contact our office. _______________________________________________________________  Recommendations made by the consultant and any test results will be sent to your referring physician. _______________________________________________________________ 

## 2018-10-11 NOTE — Progress Notes (Signed)
Patient tolerated faslodex injections with no complaints voiced.  Sites clean and dry with no bruising or swelling noted at site.  Band aids applied.  VSS with discharge and left by wheelchair with family.  No s/s of distress noted.

## 2018-10-12 LAB — CANCER ANTIGEN 15-3: CAN 15 3: 49 U/mL — AB (ref 0.0–25.0)

## 2018-10-13 ENCOUNTER — Encounter (HOSPITAL_COMMUNITY): Payer: Self-pay | Admitting: Hematology

## 2018-10-13 ENCOUNTER — Other Ambulatory Visit: Payer: Self-pay

## 2018-10-13 ENCOUNTER — Ambulatory Visit (HOSPITAL_COMMUNITY): Payer: Medicare Other

## 2018-10-13 ENCOUNTER — Inpatient Hospital Stay (HOSPITAL_BASED_OUTPATIENT_CLINIC_OR_DEPARTMENT_OTHER): Payer: Medicare Other | Admitting: Hematology

## 2018-10-13 VITALS — BP 117/42 | HR 63 | Temp 98.8°F | Resp 16 | Wt 113.0 lb

## 2018-10-13 DIAGNOSIS — Z993 Dependence on wheelchair: Secondary | ICD-10-CM

## 2018-10-13 DIAGNOSIS — M810 Age-related osteoporosis without current pathological fracture: Secondary | ICD-10-CM | POA: Diagnosis not present

## 2018-10-13 DIAGNOSIS — Z5111 Encounter for antineoplastic chemotherapy: Secondary | ICD-10-CM | POA: Diagnosis not present

## 2018-10-13 DIAGNOSIS — Z7982 Long term (current) use of aspirin: Secondary | ICD-10-CM

## 2018-10-13 DIAGNOSIS — E119 Type 2 diabetes mellitus without complications: Secondary | ICD-10-CM | POA: Diagnosis not present

## 2018-10-13 DIAGNOSIS — R51 Headache: Secondary | ICD-10-CM

## 2018-10-13 DIAGNOSIS — R531 Weakness: Secondary | ICD-10-CM | POA: Diagnosis not present

## 2018-10-13 DIAGNOSIS — Z79899 Other long term (current) drug therapy: Secondary | ICD-10-CM | POA: Diagnosis not present

## 2018-10-13 DIAGNOSIS — C50211 Malignant neoplasm of upper-inner quadrant of right female breast: Secondary | ICD-10-CM | POA: Diagnosis not present

## 2018-10-13 DIAGNOSIS — Z7984 Long term (current) use of oral hypoglycemic drugs: Secondary | ICD-10-CM | POA: Diagnosis not present

## 2018-10-13 DIAGNOSIS — K5900 Constipation, unspecified: Secondary | ICD-10-CM

## 2018-10-13 DIAGNOSIS — Z170 Estrogen receptor positive status [ER+]: Secondary | ICD-10-CM

## 2018-10-13 NOTE — Patient Instructions (Addendum)
[place] at [facility] Discharge Instructions   Follow up in 2 months with an Ultrasound of her right breast and labs prior.   Thank you for choosing [place] at [facility] to provide your oncology and hematology care.  To afford each patient quality time with our provider, please arrive at least 15 minutes before your scheduled appointment time.   If you have a lab appointment with the [facility] please come in thru the  Main Entrance and check in at the main information desk  You need to re-schedule your appointment should you arrive 10 or more minutes late.  We strive to give you quality time with our providers, and arriving late affects you and other patients whose appointments are after yours.  Also, if you no show three or more times for appointments you may be dismissed from the clinic at the providers discretion.     Again, thank you for choosing [facility].  Our hope is that these requests will decrease the amount of time that you wait before being seen by our physicians.       _____________________________________________________________  Should you have questions after your visit to [facility], please contact our office at [phone] between the hours of 8:00 a.m. and 4:30 p.m.  Voicemails left after 4:00 p.m. will not be returned until the following business day.  For prescription refill requests, have your pharmacy contact our office and allow 72 hours.    Cancer Center Support Programs:   > Cancer Support Group  2nd Tuesday of the month 1pm-2pm, Journey Room

## 2018-10-13 NOTE — Progress Notes (Signed)
Kelly Fisher, [place] [id number]   CLINIC:  Medical Oncology/Hematology  PCP:  Neale Burly, MD [place] [id number] [phone]   REASON FOR VISIT: Follow-up for right breast cancer, ER+/PR+/HER2-  CURRENT THERAPY: Anastrozole daily  BRIEF ONCOLOGIC HISTORY:    Malignant neoplasm of upper-inner quadrant of right breast in female, estrogen receptor positive ([place])   09/08/2016 Mammogram    Postprocedure bilateral mammogram, for clip placements. Bilateral cylinder shaped biopsy clips appear well positioned at the sites of the targeted masses. R breast mass at the 1:00 position, L breast mass at the 8:00 position    09/08/2016 Initial Biopsy    Ultrasound guided biopsy of bilateral breast masses    09/08/2016 Pathology Results    R breast invasive ductal carcinoma, low grade with focal micro-calcifications. 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 ER+ 90%, PR 90% HER 2 IHC 2+, FISH NEGATIVE    07/18/2018 -  Chemotherapy    The patient had fulvestrant (FASLODEX) injection 500 mg, 500 mg, Intramuscular,  Once, 3 of 7 cycles Administration: 500 mg (07/18/2018), 500 mg (08/15/2018), 500 mg (08/01/2018), 500 mg (09/12/2018)  for chemotherapy treatment.       INTERVAL HISTORY:  Ms. Kelly Fisher 84 y.o. female returns for routine follow-up for right breast cancer. She is here today with her son. She is staying in a rehab facitiy due to her falling multiple time over the past month. They are working with her to gain her strength back. She is monstly wheelchair bound at this time. She still has her right breast mass that has not appeared to grown any since last visit. We will get an ultrasound to verify. She is having issues with constipation and occasional headaches. She denies any new pains or lump present. Denies any nausea ,vomiting, or diarrhea. Denies any skin rashes. Denies any bleeding or easy bruising. She reports her  appetite at 100% and her energy level at 75%.   REVIEW OF SYSTEMS:  Review of Systems  Gastrointestinal: Positive for constipation.  Neurological: Positive for extremity weakness and headaches.  All other systems reviewed and are negative.    PAST MEDICAL/SURGICAL HISTORY:  Past Medical History:  Diagnosis Date  . CAD (coronary artery disease)   . High cholesterol   . Hypertension    Past Surgical History:  Procedure Laterality Date  . CORONARY ARTERY BYPASS GRAFT    . HIP FRACTURE SURGERY Left   . TOTAL KNEE ARTHROPLASTY Right      SOCIAL HISTORY:  Social History   Socioeconomic History  . Marital status: Married    Spouse name: Not on file  . Number of children: Not on file  . Years of education: Not on file  . Highest education level: Not on file  Occupational History  . Not on file  Social Needs  . Financial resource strain: Not on file  . Food insecurity:    Worry: Not on file    Inability: Not on file  . Transportation needs:    Medical: Not on file    Non-medical: Not on file  Tobacco Use  . Smoking status: Never Smoker  . Smokeless tobacco: Never Used  Substance and Sexual Activity  . Alcohol use: No  . Drug use: No  . Sexual activity: Not on file    Comment: married  Lifestyle  . Physical activity:    Days per week: Not on file    Minutes  per session: Not on file  . Stress: Not on file  Relationships  . Social connections:    Talks on phone: Not on file    Gets together: Not on file    Attends religious service: Not on file    Active member of club or organization: Not on file    Attends meetings of clubs or organizations: Not on file    Relationship status: Not on file  . Intimate partner violence:    Fear of current or ex partner: Not on file    Emotionally abused: Not on file    Physically abused: Not on file    Forced sexual activity: Not on file  Other Topics Concern  . Not on file  Social History Narrative   07/18/18:   Been  married 44 years. Husband is 24. They live together by themselves.    FAMILY HISTORY:  History reviewed. No pertinent family history.  CURRENT MEDICATIONS:  Outpatient Encounter Medications as of 10/13/2018  Medication Sig  . allopurinol (ZYLOPRIM) 300 MG tablet Take 300 mg by mouth daily.  Marland Kitchen aspirin EC 81 MG tablet Take 81 mg by mouth daily.  . calcium-vitamin D (OSCAL WITH D) 500-200 MG-UNIT tablet Take 2 tablets by mouth daily with breakfast.  . clopidogrel (PLAVIX) 75 MG tablet Take 75 mg by mouth daily.  Marland Kitchen denosumab (PROLIA) 60 MG/ML SOSY injection Inject 60 mg into the skin every 6 (six) months.  . ferrous sulfate 325 (65 FE) MG tablet Take by mouth.  . fulvestrant (FASLODEX) 250 MG/5ML injection Inject 500 mg into the muscle every 30 (thirty) days. One injection each buttock over 1-2 minutes. Warm prior to use.  . Lactobacillus (ACIDOPHILUS) CAPS capsule Take by mouth.  . lidocaine (LIDODERM) 5 % Apply patch to painful area. Patch may remain in place for up to 12 hours in a 24 hour period.  Marland Kitchen lisinopril (PRINIVIL,ZESTRIL) 10 MG tablet Take 10 mg by mouth daily.   . magnesium oxide (MAG-OX) 400 MG tablet Take by mouth.  . Melatonin 3 MG TABS Take by mouth.  . metaxalone (SKELAXIN) 800 MG tablet TAKE 1 TABLET BY MOUTH 3 TIMES A DAY AS NEEDED FOR MUSCLE SPASM  . metFORMIN (GLUCOPHAGE) 500 MG tablet Take 1,000 mg by mouth 2 (two) times daily.  . metoprolol tartrate (LOPRESSOR) 25 MG tablet Take 25 mg by mouth daily.  Marland Kitchen oxyCODONE-acetaminophen (PERCOCET/ROXICET) 5-325 MG tablet Take 1 tablet by mouth every 6 (six) hours as needed. for pain  . pantoprazole (PROTONIX) 40 MG tablet Take 40 mg by mouth daily.  . polyethylene glycol (MIRALAX / GLYCOLAX) packet Take 17 g by mouth.  . polyethylene glycol (MIRALAX / GLYCOLAX) packet Take by mouth.  . simvastatin (ZOCOR) 40 MG tablet Take 40 mg by mouth.   No facility-administered encounter medications on file as of 10/13/2018.      ALLERGIES:  No Known Allergies   PHYSICAL EXAM:  ECOG Performance status: 3  Vitals:   10/13/18 1058  BP: (!) 117/42  Pulse: 63  Resp: 16  Temp: 98.8 F (37.1 C)  SpO2: 96%   Filed Weights   10/13/18 1058  Weight: 113 lb (51.3 kg)    Physical Exam Breast exam: Right breast upper inner quadrant mass measures 3 x 3 cm, freely mobile.  Skin is not involved.  No palpable supraclavicular or axillary adenopathy.  Abdomen is soft nontender with no palpable organomegaly.  LABORATORY DATA:  I have reviewed the labs as listed.  CBC  Component Value Date/Time   WBC 5.3 10/11/2018 1407   RBC 3.68 (L) 10/11/2018 1407   HGB 10.9 (L) 10/11/2018 1407   HCT 35.4 (L) 10/11/2018 1407   PLT 168 10/11/2018 1407   MCV 96.2 10/11/2018 1407   MCH 29.6 10/11/2018 1407   MCHC 30.8 10/11/2018 1407   RDW 14.1 10/11/2018 1407   LYMPHSABS 0.9 10/11/2018 1407   MONOABS 0.8 10/11/2018 1407   EOSABS 0.2 10/11/2018 1407   BASOSABS 0.0 10/11/2018 1407   CMP Latest Ref Rng & Units 10/11/2018 07/13/2018 01/16/2018  Glucose 70 - 99 mg/dL 118(H) 127(H) 117(H)  BUN 8 - 23 mg/dL _0 Creatinine 0.44 - 1.00 mg/dL 0.44 0.76 0.88  Sodium 135 - 145 mmol/L 139 143 139  Potassium 3.5 - 5.1 mmol/L 4.1 4.8 3.9  Chloride 98 - 111 mmol/L 107 105 103  CO2 22 - 32 mmol/L _1 Calcium 8.9 - 10.3 mg/dL 8.8(L) 9.5 8.8(L)  Total Protein 6.5 - 8.1 g/dL 7.0 7.3 7.1  Total Bilirubin 0.3 - 1.2 mg/dL 0.7 0.7 0.6  Alkaline Phos 38 - 126 U/L 53 53 55  AST 15 - 41 U/L _2 ALT 0 - 44 U/L 13 11 10(L)        ASSESSMENT & PLAN:   Malignant neoplasm of upper-inner quadrant of right breast in female, estrogen receptor positive ([place]) 1.  Right breast infiltrating ductal carcinoma, ER/PR positive and HER-2 negative: -Diagnosed on 09/08/2016 by biopsy, surgery was refused, anastrozole started - Ultrasound of the right breast in September 2018 showed improvement in size, ultrasound in February 2019  showed more or less stable disease. - She is taking anastrozole without missing doses. -Ultrasound of the breast on 07/18/2018 showed increase in the size of the mass measuring 2.8 x 1.6 x 2.7 cm, previously 2.4 x 1 x 1.8 cm.  - I have switched her treatment to Faslodex on 07/18/2018.  She is currently receiving them once a month.  She is tolerating it very well. -Today's physical examination reveals stable mass measuring about 3 x 3 cm in the right breast upper inner quadrant.  No palpable adenopathy in the axillary or supraclavicular region.  CA 15-3 is stable in the 40s. - I will see her back in 2 months for follow-up.  I plan to repeat an ultrasound of the right breast to see if there is any progression.  We will also repeat blood work at that time.  2.  Osteoporosis: -She is continuing Prolia every 6 months.  She will continue calcium and vitamin D twice daily.   3.  Right middle lobe lung nodule: -On PET scan in November 2017 has shown slight growth since 2014.  This was hypermetabolic. -CT scan of the chest on 07/13/2018 shows right lung nodules unchanged from 2012, indicating benign etiology.       Orders placed this encounter:  Orders Placed This Encounter  Procedures  . US Breast Limited Uni Right Inc Axilla  . Cancer antigen 15-3  . CBC with Differential/Platelet  . Comprehensive metabolic panel      Derek Jack, MD [place] [phone]

## 2018-10-13 NOTE — Assessment & Plan Note (Signed)
1.  Right breast infiltrating ductal carcinoma, ER/PR positive and HER-2 negative: -Diagnosed on 09/08/2016 by biopsy, surgery was refused, anastrozole started - Ultrasound of the right breast in September 2018 showed improvement in size, ultrasound in February 2019 showed more or less stable disease. - She is taking anastrozole without missing doses. -Ultrasound of the breast on 07/18/2018 showed increase in the size of the mass measuring 2.8 x 1.6 x 2.7 cm, previously 2.4 x 1 x 1.8 cm.  - I have switched her treatment to Faslodex on 07/18/2018.  She is currently receiving them once a month.  She is tolerating it very well. -Today's physical examination reveals stable mass measuring about 3 x 3 cm in the right breast upper inner quadrant.  No palpable adenopathy in the axillary or supraclavicular region.  CA 15-3 is stable in the 40s. - I will see her back in 2 months for follow-up.  I plan to repeat an ultrasound of the right breast to see if there is any progression.  We will also repeat blood work at that time.  2.  Osteoporosis: -She is continuing Prolia every 6 months.  She will continue calcium and vitamin D twice daily.   3.  Right middle lobe lung nodule: -On PET scan in November 2017 has shown slight growth since 2014.  This was hypermetabolic. -CT scan of the chest on 07/13/2018 shows right lung nodules unchanged from 2012, indicating benign etiology.

## 2018-10-25 DIAGNOSIS — E785 Hyperlipidemia, unspecified: Secondary | ICD-10-CM | POA: Diagnosis not present

## 2018-10-25 DIAGNOSIS — E119 Type 2 diabetes mellitus without complications: Secondary | ICD-10-CM | POA: Diagnosis not present

## 2018-10-25 DIAGNOSIS — Z952 Presence of prosthetic heart valve: Secondary | ICD-10-CM | POA: Diagnosis not present

## 2018-10-25 DIAGNOSIS — N390 Urinary tract infection, site not specified: Secondary | ICD-10-CM | POA: Diagnosis not present

## 2018-10-25 DIAGNOSIS — I509 Heart failure, unspecified: Secondary | ICD-10-CM | POA: Diagnosis not present

## 2018-10-25 DIAGNOSIS — Z794 Long term (current) use of insulin: Secondary | ICD-10-CM | POA: Diagnosis not present

## 2018-10-25 DIAGNOSIS — R296 Repeated falls: Secondary | ICD-10-CM | POA: Diagnosis not present

## 2018-10-25 DIAGNOSIS — I110 Hypertensive heart disease with heart failure: Secondary | ICD-10-CM | POA: Diagnosis not present

## 2018-10-26 DIAGNOSIS — E785 Hyperlipidemia, unspecified: Secondary | ICD-10-CM | POA: Diagnosis not present

## 2018-10-26 DIAGNOSIS — I110 Hypertensive heart disease with heart failure: Secondary | ICD-10-CM | POA: Diagnosis not present

## 2018-10-26 DIAGNOSIS — R296 Repeated falls: Secondary | ICD-10-CM | POA: Diagnosis not present

## 2018-10-26 DIAGNOSIS — I509 Heart failure, unspecified: Secondary | ICD-10-CM | POA: Diagnosis not present

## 2018-10-26 DIAGNOSIS — N390 Urinary tract infection, site not specified: Secondary | ICD-10-CM | POA: Diagnosis not present

## 2018-10-26 DIAGNOSIS — E119 Type 2 diabetes mellitus without complications: Secondary | ICD-10-CM | POA: Diagnosis not present

## 2018-10-30 DIAGNOSIS — N390 Urinary tract infection, site not specified: Secondary | ICD-10-CM | POA: Diagnosis not present

## 2018-10-30 DIAGNOSIS — I509 Heart failure, unspecified: Secondary | ICD-10-CM | POA: Diagnosis not present

## 2018-10-30 DIAGNOSIS — R296 Repeated falls: Secondary | ICD-10-CM | POA: Diagnosis not present

## 2018-10-30 DIAGNOSIS — E119 Type 2 diabetes mellitus without complications: Secondary | ICD-10-CM | POA: Diagnosis not present

## 2018-10-30 DIAGNOSIS — I110 Hypertensive heart disease with heart failure: Secondary | ICD-10-CM | POA: Diagnosis not present

## 2018-10-30 DIAGNOSIS — E785 Hyperlipidemia, unspecified: Secondary | ICD-10-CM | POA: Diagnosis not present

## 2018-10-31 DIAGNOSIS — I509 Heart failure, unspecified: Secondary | ICD-10-CM | POA: Diagnosis not present

## 2018-10-31 DIAGNOSIS — R296 Repeated falls: Secondary | ICD-10-CM | POA: Diagnosis not present

## 2018-10-31 DIAGNOSIS — N390 Urinary tract infection, site not specified: Secondary | ICD-10-CM | POA: Diagnosis not present

## 2018-10-31 DIAGNOSIS — I110 Hypertensive heart disease with heart failure: Secondary | ICD-10-CM | POA: Diagnosis not present

## 2018-10-31 DIAGNOSIS — E785 Hyperlipidemia, unspecified: Secondary | ICD-10-CM | POA: Diagnosis not present

## 2018-10-31 DIAGNOSIS — E119 Type 2 diabetes mellitus without complications: Secondary | ICD-10-CM | POA: Diagnosis not present

## 2018-11-02 DIAGNOSIS — R296 Repeated falls: Secondary | ICD-10-CM | POA: Diagnosis not present

## 2018-11-02 DIAGNOSIS — I110 Hypertensive heart disease with heart failure: Secondary | ICD-10-CM | POA: Diagnosis not present

## 2018-11-02 DIAGNOSIS — E785 Hyperlipidemia, unspecified: Secondary | ICD-10-CM | POA: Diagnosis not present

## 2018-11-02 DIAGNOSIS — N390 Urinary tract infection, site not specified: Secondary | ICD-10-CM | POA: Diagnosis not present

## 2018-11-02 DIAGNOSIS — E119 Type 2 diabetes mellitus without complications: Secondary | ICD-10-CM | POA: Diagnosis not present

## 2018-11-02 DIAGNOSIS — I509 Heart failure, unspecified: Secondary | ICD-10-CM | POA: Diagnosis not present

## 2018-11-06 DIAGNOSIS — R296 Repeated falls: Secondary | ICD-10-CM | POA: Diagnosis not present

## 2018-11-06 DIAGNOSIS — I110 Hypertensive heart disease with heart failure: Secondary | ICD-10-CM | POA: Diagnosis not present

## 2018-11-06 DIAGNOSIS — E785 Hyperlipidemia, unspecified: Secondary | ICD-10-CM | POA: Diagnosis not present

## 2018-11-06 DIAGNOSIS — E119 Type 2 diabetes mellitus without complications: Secondary | ICD-10-CM | POA: Diagnosis not present

## 2018-11-06 DIAGNOSIS — I509 Heart failure, unspecified: Secondary | ICD-10-CM | POA: Diagnosis not present

## 2018-11-06 DIAGNOSIS — N390 Urinary tract infection, site not specified: Secondary | ICD-10-CM | POA: Diagnosis not present

## 2018-11-07 DIAGNOSIS — E119 Type 2 diabetes mellitus without complications: Secondary | ICD-10-CM | POA: Diagnosis not present

## 2018-11-07 DIAGNOSIS — I110 Hypertensive heart disease with heart failure: Secondary | ICD-10-CM | POA: Diagnosis not present

## 2018-11-07 DIAGNOSIS — N390 Urinary tract infection, site not specified: Secondary | ICD-10-CM | POA: Diagnosis not present

## 2018-11-07 DIAGNOSIS — I509 Heart failure, unspecified: Secondary | ICD-10-CM | POA: Diagnosis not present

## 2018-11-07 DIAGNOSIS — R296 Repeated falls: Secondary | ICD-10-CM | POA: Diagnosis not present

## 2018-11-07 DIAGNOSIS — E785 Hyperlipidemia, unspecified: Secondary | ICD-10-CM | POA: Diagnosis not present

## 2018-11-08 ENCOUNTER — Inpatient Hospital Stay (HOSPITAL_COMMUNITY): Payer: Medicare Other | Attending: Hematology

## 2018-11-08 ENCOUNTER — Encounter (HOSPITAL_COMMUNITY): Payer: Self-pay

## 2018-11-08 VITALS — BP 117/46 | HR 68 | Temp 98.5°F | Resp 16

## 2018-11-08 DIAGNOSIS — Z5111 Encounter for antineoplastic chemotherapy: Secondary | ICD-10-CM | POA: Insufficient documentation

## 2018-11-08 DIAGNOSIS — Z170 Estrogen receptor positive status [ER+]: Secondary | ICD-10-CM | POA: Insufficient documentation

## 2018-11-08 DIAGNOSIS — C50211 Malignant neoplasm of upper-inner quadrant of right female breast: Secondary | ICD-10-CM | POA: Diagnosis not present

## 2018-11-08 MED ORDER — FULVESTRANT 250 MG/5ML IM SOLN
500.0000 mg | Freq: Once | INTRAMUSCULAR | Status: AC
  Administered 2018-11-08: 500 mg via INTRAMUSCULAR

## 2018-11-08 NOTE — Progress Notes (Signed)
Patient tolerated treatment with no complaints voiced.  Bilateral sites clean and dry with no bruising or swelling noted.  Band aids applied.  Left by wheelchair with no s/s of distress noted.

## 2018-11-08 NOTE — Patient Instructions (Signed)
[facility]  Discharge Instructions:   _______________________________________________________________  Thank you for choosing [facility] at [facility] to provide your oncology and hematology care.  To afford each patient quality time with our providers, please arrive at least 15 minutes before your scheduled appointment.  You need to re-schedule your appointment if you arrive 10 or more minutes late.  We strive to give you quality time with our providers, and arriving late affects you and other patients whose appointments are after yours.  Also, if you no show three or more times for appointments you may be dismissed from the clinic.  Again, thank you for choosing [facility] at [facility]. Our hope is that these requests will allow you access to exceptional care and in a timely manner. _______________________________________________________________  If you have questions after your visit, please contact our office at [phone] between the hours of 8:30 a.m. and 5:00 p.m. Voicemails left after 4:30 p.m. will not be returned until the following business day. _______________________________________________________________  For prescription refill requests, have your pharmacy contact our office. _______________________________________________________________  Recommendations made by the consultant and any test results will be sent to your referring physician. _______________________________________________________________ 

## 2018-11-09 DIAGNOSIS — M10242 Drug-induced gout, left hand: Secondary | ICD-10-CM | POA: Diagnosis not present

## 2018-11-09 DIAGNOSIS — I10 Essential (primary) hypertension: Secondary | ICD-10-CM | POA: Diagnosis not present

## 2018-11-09 DIAGNOSIS — K210 Gastro-esophageal reflux disease with esophagitis: Secondary | ICD-10-CM | POA: Diagnosis not present

## 2018-11-09 DIAGNOSIS — E1165 Type 2 diabetes mellitus with hyperglycemia: Secondary | ICD-10-CM | POA: Diagnosis not present

## 2018-11-10 DIAGNOSIS — I509 Heart failure, unspecified: Secondary | ICD-10-CM | POA: Diagnosis not present

## 2018-11-10 DIAGNOSIS — E785 Hyperlipidemia, unspecified: Secondary | ICD-10-CM | POA: Diagnosis not present

## 2018-11-10 DIAGNOSIS — N390 Urinary tract infection, site not specified: Secondary | ICD-10-CM | POA: Diagnosis not present

## 2018-11-10 DIAGNOSIS — R296 Repeated falls: Secondary | ICD-10-CM | POA: Diagnosis not present

## 2018-11-10 DIAGNOSIS — I110 Hypertensive heart disease with heart failure: Secondary | ICD-10-CM | POA: Diagnosis not present

## 2018-11-10 DIAGNOSIS — E119 Type 2 diabetes mellitus without complications: Secondary | ICD-10-CM | POA: Diagnosis not present

## 2018-11-13 DIAGNOSIS — E119 Type 2 diabetes mellitus without complications: Secondary | ICD-10-CM | POA: Diagnosis not present

## 2018-11-13 DIAGNOSIS — I110 Hypertensive heart disease with heart failure: Secondary | ICD-10-CM | POA: Diagnosis not present

## 2018-11-13 DIAGNOSIS — I509 Heart failure, unspecified: Secondary | ICD-10-CM | POA: Diagnosis not present

## 2018-11-13 DIAGNOSIS — R296 Repeated falls: Secondary | ICD-10-CM | POA: Diagnosis not present

## 2018-11-13 DIAGNOSIS — E785 Hyperlipidemia, unspecified: Secondary | ICD-10-CM | POA: Diagnosis not present

## 2018-11-13 DIAGNOSIS — N390 Urinary tract infection, site not specified: Secondary | ICD-10-CM | POA: Diagnosis not present

## 2018-11-18 DIAGNOSIS — I509 Heart failure, unspecified: Secondary | ICD-10-CM | POA: Diagnosis not present

## 2018-11-18 DIAGNOSIS — N390 Urinary tract infection, site not specified: Secondary | ICD-10-CM | POA: Diagnosis not present

## 2018-11-18 DIAGNOSIS — E119 Type 2 diabetes mellitus without complications: Secondary | ICD-10-CM | POA: Diagnosis not present

## 2018-11-18 DIAGNOSIS — E785 Hyperlipidemia, unspecified: Secondary | ICD-10-CM | POA: Diagnosis not present

## 2018-11-18 DIAGNOSIS — R296 Repeated falls: Secondary | ICD-10-CM | POA: Diagnosis not present

## 2018-11-18 DIAGNOSIS — I110 Hypertensive heart disease with heart failure: Secondary | ICD-10-CM | POA: Diagnosis not present

## 2018-11-23 DIAGNOSIS — I509 Heart failure, unspecified: Secondary | ICD-10-CM | POA: Diagnosis not present

## 2018-11-23 DIAGNOSIS — E785 Hyperlipidemia, unspecified: Secondary | ICD-10-CM | POA: Diagnosis not present

## 2018-11-23 DIAGNOSIS — R296 Repeated falls: Secondary | ICD-10-CM | POA: Diagnosis not present

## 2018-11-23 DIAGNOSIS — I110 Hypertensive heart disease with heart failure: Secondary | ICD-10-CM | POA: Diagnosis not present

## 2018-11-23 DIAGNOSIS — E119 Type 2 diabetes mellitus without complications: Secondary | ICD-10-CM | POA: Diagnosis not present

## 2018-11-23 DIAGNOSIS — N390 Urinary tract infection, site not specified: Secondary | ICD-10-CM | POA: Diagnosis not present

## 2018-11-24 DIAGNOSIS — I110 Hypertensive heart disease with heart failure: Secondary | ICD-10-CM | POA: Diagnosis not present

## 2018-11-24 DIAGNOSIS — E119 Type 2 diabetes mellitus without complications: Secondary | ICD-10-CM | POA: Diagnosis not present

## 2018-11-24 DIAGNOSIS — R296 Repeated falls: Secondary | ICD-10-CM | POA: Diagnosis not present

## 2018-11-24 DIAGNOSIS — E785 Hyperlipidemia, unspecified: Secondary | ICD-10-CM | POA: Diagnosis not present

## 2018-11-24 DIAGNOSIS — N390 Urinary tract infection, site not specified: Secondary | ICD-10-CM | POA: Diagnosis not present

## 2018-11-24 DIAGNOSIS — I509 Heart failure, unspecified: Secondary | ICD-10-CM | POA: Diagnosis not present

## 2018-11-27 DIAGNOSIS — N390 Urinary tract infection, site not specified: Secondary | ICD-10-CM | POA: Diagnosis not present

## 2018-11-27 DIAGNOSIS — E785 Hyperlipidemia, unspecified: Secondary | ICD-10-CM | POA: Diagnosis not present

## 2018-11-27 DIAGNOSIS — I509 Heart failure, unspecified: Secondary | ICD-10-CM | POA: Diagnosis not present

## 2018-11-27 DIAGNOSIS — E119 Type 2 diabetes mellitus without complications: Secondary | ICD-10-CM | POA: Diagnosis not present

## 2018-11-27 DIAGNOSIS — R296 Repeated falls: Secondary | ICD-10-CM | POA: Diagnosis not present

## 2018-11-27 DIAGNOSIS — I110 Hypertensive heart disease with heart failure: Secondary | ICD-10-CM | POA: Diagnosis not present

## 2018-11-28 DIAGNOSIS — R296 Repeated falls: Secondary | ICD-10-CM | POA: Diagnosis not present

## 2018-11-28 DIAGNOSIS — E119 Type 2 diabetes mellitus without complications: Secondary | ICD-10-CM | POA: Diagnosis not present

## 2018-11-28 DIAGNOSIS — I110 Hypertensive heart disease with heart failure: Secondary | ICD-10-CM | POA: Diagnosis not present

## 2018-11-28 DIAGNOSIS — N390 Urinary tract infection, site not specified: Secondary | ICD-10-CM | POA: Diagnosis not present

## 2018-11-28 DIAGNOSIS — E785 Hyperlipidemia, unspecified: Secondary | ICD-10-CM | POA: Diagnosis not present

## 2018-11-28 DIAGNOSIS — I509 Heart failure, unspecified: Secondary | ICD-10-CM | POA: Diagnosis not present

## 2018-11-30 DIAGNOSIS — R296 Repeated falls: Secondary | ICD-10-CM | POA: Diagnosis not present

## 2018-11-30 DIAGNOSIS — N390 Urinary tract infection, site not specified: Secondary | ICD-10-CM | POA: Diagnosis not present

## 2018-11-30 DIAGNOSIS — E119 Type 2 diabetes mellitus without complications: Secondary | ICD-10-CM | POA: Diagnosis not present

## 2018-11-30 DIAGNOSIS — E785 Hyperlipidemia, unspecified: Secondary | ICD-10-CM | POA: Diagnosis not present

## 2018-11-30 DIAGNOSIS — I110 Hypertensive heart disease with heart failure: Secondary | ICD-10-CM | POA: Diagnosis not present

## 2018-11-30 DIAGNOSIS — I509 Heart failure, unspecified: Secondary | ICD-10-CM | POA: Diagnosis not present

## 2018-12-04 DIAGNOSIS — E119 Type 2 diabetes mellitus without complications: Secondary | ICD-10-CM | POA: Diagnosis not present

## 2018-12-04 DIAGNOSIS — I110 Hypertensive heart disease with heart failure: Secondary | ICD-10-CM | POA: Diagnosis not present

## 2018-12-04 DIAGNOSIS — I5032 Chronic diastolic (congestive) heart failure: Secondary | ICD-10-CM | POA: Diagnosis not present

## 2018-12-04 DIAGNOSIS — N179 Acute kidney failure, unspecified: Secondary | ICD-10-CM | POA: Diagnosis not present

## 2018-12-04 DIAGNOSIS — R296 Repeated falls: Secondary | ICD-10-CM | POA: Diagnosis not present

## 2018-12-04 DIAGNOSIS — R55 Syncope and collapse: Secondary | ICD-10-CM | POA: Diagnosis not present

## 2018-12-04 DIAGNOSIS — N390 Urinary tract infection, site not specified: Secondary | ICD-10-CM | POA: Diagnosis not present

## 2018-12-04 DIAGNOSIS — E860 Dehydration: Secondary | ICD-10-CM | POA: Diagnosis not present

## 2018-12-04 DIAGNOSIS — E785 Hyperlipidemia, unspecified: Secondary | ICD-10-CM | POA: Diagnosis not present

## 2018-12-04 DIAGNOSIS — I509 Heart failure, unspecified: Secondary | ICD-10-CM | POA: Diagnosis not present

## 2018-12-04 DIAGNOSIS — R630 Anorexia: Secondary | ICD-10-CM | POA: Diagnosis not present

## 2018-12-04 DIAGNOSIS — Z681 Body mass index (BMI) 19 or less, adult: Secondary | ICD-10-CM | POA: Diagnosis not present

## 2018-12-04 DIAGNOSIS — W19XXXA Unspecified fall, initial encounter: Secondary | ICD-10-CM | POA: Diagnosis not present

## 2018-12-04 DIAGNOSIS — E872 Acidosis: Secondary | ICD-10-CM | POA: Diagnosis not present

## 2018-12-04 DIAGNOSIS — S4992XA Unspecified injury of left shoulder and upper arm, initial encounter: Secondary | ICD-10-CM | POA: Diagnosis not present

## 2018-12-05 DIAGNOSIS — M1711 Unilateral primary osteoarthritis, right knee: Secondary | ICD-10-CM | POA: Diagnosis not present

## 2018-12-05 DIAGNOSIS — Z79899 Other long term (current) drug therapy: Secondary | ICD-10-CM | POA: Diagnosis not present

## 2018-12-05 DIAGNOSIS — N958 Other specified menopausal and perimenopausal disorders: Secondary | ICD-10-CM | POA: Diagnosis not present

## 2018-12-05 DIAGNOSIS — K449 Diaphragmatic hernia without obstruction or gangrene: Secondary | ICD-10-CM | POA: Diagnosis present

## 2018-12-05 DIAGNOSIS — I5032 Chronic diastolic (congestive) heart failure: Secondary | ICD-10-CM | POA: Diagnosis present

## 2018-12-05 DIAGNOSIS — E785 Hyperlipidemia, unspecified: Secondary | ICD-10-CM | POA: Diagnosis present

## 2018-12-05 DIAGNOSIS — Z952 Presence of prosthetic heart valve: Secondary | ICD-10-CM | POA: Diagnosis not present

## 2018-12-05 DIAGNOSIS — R531 Weakness: Secondary | ICD-10-CM | POA: Diagnosis present

## 2018-12-05 DIAGNOSIS — E119 Type 2 diabetes mellitus without complications: Secondary | ICD-10-CM | POA: Diagnosis present

## 2018-12-05 DIAGNOSIS — R262 Difficulty in walking, not elsewhere classified: Secondary | ICD-10-CM | POA: Diagnosis not present

## 2018-12-05 DIAGNOSIS — I2510 Atherosclerotic heart disease of native coronary artery without angina pectoris: Secondary | ICD-10-CM | POA: Diagnosis present

## 2018-12-05 DIAGNOSIS — Z7982 Long term (current) use of aspirin: Secondary | ICD-10-CM | POA: Diagnosis not present

## 2018-12-05 DIAGNOSIS — I110 Hypertensive heart disease with heart failure: Secondary | ICD-10-CM | POA: Diagnosis present

## 2018-12-05 DIAGNOSIS — Z794 Long term (current) use of insulin: Secondary | ICD-10-CM | POA: Diagnosis not present

## 2018-12-05 DIAGNOSIS — I10 Essential (primary) hypertension: Secondary | ICD-10-CM | POA: Diagnosis not present

## 2018-12-05 DIAGNOSIS — Z681 Body mass index (BMI) 19 or less, adult: Secondary | ICD-10-CM | POA: Diagnosis not present

## 2018-12-05 DIAGNOSIS — R296 Repeated falls: Secondary | ICD-10-CM | POA: Diagnosis present

## 2018-12-05 DIAGNOSIS — E860 Dehydration: Secondary | ICD-10-CM | POA: Diagnosis not present

## 2018-12-05 DIAGNOSIS — Z951 Presence of aortocoronary bypass graft: Secondary | ICD-10-CM | POA: Diagnosis not present

## 2018-12-05 DIAGNOSIS — Z743 Need for continuous supervision: Secondary | ICD-10-CM | POA: Diagnosis not present

## 2018-12-05 DIAGNOSIS — R5381 Other malaise: Secondary | ICD-10-CM | POA: Diagnosis not present

## 2018-12-05 DIAGNOSIS — R630 Anorexia: Secondary | ICD-10-CM | POA: Diagnosis present

## 2018-12-05 DIAGNOSIS — M6281 Muscle weakness (generalized): Secondary | ICD-10-CM | POA: Diagnosis not present

## 2018-12-05 DIAGNOSIS — E872 Acidosis: Secondary | ICD-10-CM | POA: Diagnosis not present

## 2018-12-05 DIAGNOSIS — Z780 Asymptomatic menopausal state: Secondary | ICD-10-CM | POA: Diagnosis not present

## 2018-12-05 DIAGNOSIS — N179 Acute kidney failure, unspecified: Secondary | ICD-10-CM | POA: Diagnosis not present

## 2018-12-05 DIAGNOSIS — Z96651 Presence of right artificial knee joint: Secondary | ICD-10-CM | POA: Diagnosis present

## 2018-12-06 ENCOUNTER — Ambulatory Visit (HOSPITAL_COMMUNITY): Payer: Medicare Other

## 2018-12-06 ENCOUNTER — Other Ambulatory Visit (HOSPITAL_COMMUNITY): Payer: Medicare Other

## 2018-12-12 ENCOUNTER — Ambulatory Visit (HOSPITAL_COMMUNITY): Payer: Medicare Other | Admitting: Hematology

## 2018-12-12 ENCOUNTER — Other Ambulatory Visit (HOSPITAL_COMMUNITY): Payer: Self-pay | Admitting: Nurse Practitioner

## 2018-12-12 DIAGNOSIS — Z743 Need for continuous supervision: Secondary | ICD-10-CM | POA: Diagnosis not present

## 2018-12-12 DIAGNOSIS — R5381 Other malaise: Secondary | ICD-10-CM | POA: Diagnosis not present

## 2018-12-12 DIAGNOSIS — E785 Hyperlipidemia, unspecified: Secondary | ICD-10-CM | POA: Diagnosis not present

## 2018-12-12 DIAGNOSIS — R531 Weakness: Secondary | ICD-10-CM | POA: Diagnosis not present

## 2018-12-12 DIAGNOSIS — I2510 Atherosclerotic heart disease of native coronary artery without angina pectoris: Secondary | ICD-10-CM | POA: Diagnosis not present

## 2018-12-12 DIAGNOSIS — R630 Anorexia: Secondary | ICD-10-CM | POA: Diagnosis not present

## 2018-12-12 DIAGNOSIS — N179 Acute kidney failure, unspecified: Secondary | ICD-10-CM | POA: Diagnosis not present

## 2018-12-12 DIAGNOSIS — I5032 Chronic diastolic (congestive) heart failure: Secondary | ICD-10-CM | POA: Diagnosis not present

## 2018-12-12 DIAGNOSIS — E860 Dehydration: Secondary | ICD-10-CM | POA: Diagnosis not present

## 2018-12-12 DIAGNOSIS — Z681 Body mass index (BMI) 19 or less, adult: Secondary | ICD-10-CM | POA: Diagnosis not present

## 2018-12-12 DIAGNOSIS — M1711 Unilateral primary osteoarthritis, right knee: Secondary | ICD-10-CM | POA: Diagnosis not present

## 2018-12-12 DIAGNOSIS — N958 Other specified menopausal and perimenopausal disorders: Secondary | ICD-10-CM | POA: Diagnosis not present

## 2018-12-12 DIAGNOSIS — I10 Essential (primary) hypertension: Secondary | ICD-10-CM | POA: Diagnosis not present

## 2018-12-12 DIAGNOSIS — M6281 Muscle weakness (generalized): Secondary | ICD-10-CM | POA: Diagnosis not present

## 2018-12-12 DIAGNOSIS — K449 Diaphragmatic hernia without obstruction or gangrene: Secondary | ICD-10-CM | POA: Diagnosis not present

## 2018-12-12 DIAGNOSIS — E119 Type 2 diabetes mellitus without complications: Secondary | ICD-10-CM | POA: Diagnosis not present

## 2018-12-12 DIAGNOSIS — R262 Difficulty in walking, not elsewhere classified: Secondary | ICD-10-CM | POA: Diagnosis not present

## 2018-12-12 DIAGNOSIS — E872 Acidosis: Secondary | ICD-10-CM | POA: Diagnosis not present

## 2018-12-12 DIAGNOSIS — Z853 Personal history of malignant neoplasm of breast: Secondary | ICD-10-CM

## 2018-12-13 ENCOUNTER — Other Ambulatory Visit (HOSPITAL_COMMUNITY): Payer: Self-pay | Admitting: Nurse Practitioner

## 2018-12-13 DIAGNOSIS — N179 Acute kidney failure, unspecified: Secondary | ICD-10-CM | POA: Diagnosis not present

## 2018-12-13 DIAGNOSIS — E872 Acidosis: Secondary | ICD-10-CM | POA: Diagnosis not present

## 2018-12-13 DIAGNOSIS — R928 Other abnormal and inconclusive findings on diagnostic imaging of breast: Secondary | ICD-10-CM

## 2018-12-13 DIAGNOSIS — R630 Anorexia: Secondary | ICD-10-CM | POA: Diagnosis not present

## 2018-12-13 DIAGNOSIS — E860 Dehydration: Secondary | ICD-10-CM | POA: Diagnosis not present

## 2018-12-19 ENCOUNTER — Ambulatory Visit (HOSPITAL_COMMUNITY): Payer: Medicare Other

## 2018-12-19 ENCOUNTER — Other Ambulatory Visit (HOSPITAL_COMMUNITY): Payer: Medicare Other

## 2018-12-26 DIAGNOSIS — R531 Weakness: Secondary | ICD-10-CM | POA: Diagnosis not present

## 2018-12-26 DIAGNOSIS — E860 Dehydration: Secondary | ICD-10-CM | POA: Diagnosis not present

## 2018-12-26 DIAGNOSIS — E119 Type 2 diabetes mellitus without complications: Secondary | ICD-10-CM | POA: Diagnosis not present

## 2018-12-26 DIAGNOSIS — I10 Essential (primary) hypertension: Secondary | ICD-10-CM | POA: Diagnosis not present

## 2018-12-29 DIAGNOSIS — E119 Type 2 diabetes mellitus without complications: Secondary | ICD-10-CM | POA: Diagnosis not present

## 2018-12-29 DIAGNOSIS — I10 Essential (primary) hypertension: Secondary | ICD-10-CM | POA: Diagnosis not present

## 2018-12-29 DIAGNOSIS — E860 Dehydration: Secondary | ICD-10-CM | POA: Diagnosis not present

## 2018-12-29 DIAGNOSIS — R531 Weakness: Secondary | ICD-10-CM | POA: Diagnosis not present

## 2019-01-03 ENCOUNTER — Ambulatory Visit (HOSPITAL_COMMUNITY): Payer: Medicare Other

## 2019-01-03 ENCOUNTER — Ambulatory Visit (HOSPITAL_COMMUNITY): Payer: Medicare Other | Admitting: Hematology

## 2019-01-05 DIAGNOSIS — K219 Gastro-esophageal reflux disease without esophagitis: Secondary | ICD-10-CM | POA: Diagnosis not present

## 2019-01-05 DIAGNOSIS — E872 Acidosis: Secondary | ICD-10-CM | POA: Diagnosis not present

## 2019-01-05 DIAGNOSIS — I5032 Chronic diastolic (congestive) heart failure: Secondary | ICD-10-CM | POA: Diagnosis not present

## 2019-01-05 DIAGNOSIS — E860 Dehydration: Secondary | ICD-10-CM | POA: Diagnosis not present

## 2019-01-05 DIAGNOSIS — E119 Type 2 diabetes mellitus without complications: Secondary | ICD-10-CM | POA: Diagnosis not present

## 2019-01-05 DIAGNOSIS — M4726 Other spondylosis with radiculopathy, lumbar region: Secondary | ICD-10-CM | POA: Diagnosis not present

## 2019-01-05 DIAGNOSIS — M5136 Other intervertebral disc degeneration, lumbar region: Secondary | ICD-10-CM | POA: Diagnosis not present

## 2019-01-05 DIAGNOSIS — N179 Acute kidney failure, unspecified: Secondary | ICD-10-CM | POA: Diagnosis not present

## 2019-01-05 DIAGNOSIS — K449 Diaphragmatic hernia without obstruction or gangrene: Secondary | ICD-10-CM | POA: Diagnosis not present

## 2019-01-05 DIAGNOSIS — Z9181 History of falling: Secondary | ICD-10-CM | POA: Diagnosis not present

## 2019-01-05 DIAGNOSIS — E785 Hyperlipidemia, unspecified: Secondary | ICD-10-CM | POA: Diagnosis not present

## 2019-01-05 DIAGNOSIS — Z951 Presence of aortocoronary bypass graft: Secondary | ICD-10-CM | POA: Diagnosis not present

## 2019-01-05 DIAGNOSIS — I110 Hypertensive heart disease with heart failure: Secondary | ICD-10-CM | POA: Diagnosis not present

## 2019-01-05 DIAGNOSIS — I2510 Atherosclerotic heart disease of native coronary artery without angina pectoris: Secondary | ICD-10-CM | POA: Diagnosis not present

## 2019-01-05 DIAGNOSIS — Z96651 Presence of right artificial knee joint: Secondary | ICD-10-CM | POA: Diagnosis not present

## 2019-01-05 DIAGNOSIS — M19012 Primary osteoarthritis, left shoulder: Secondary | ICD-10-CM | POA: Diagnosis not present

## 2019-01-05 DIAGNOSIS — Z7984 Long term (current) use of oral hypoglycemic drugs: Secondary | ICD-10-CM | POA: Diagnosis not present

## 2019-01-08 DIAGNOSIS — M5136 Other intervertebral disc degeneration, lumbar region: Secondary | ICD-10-CM | POA: Diagnosis not present

## 2019-01-08 DIAGNOSIS — I5032 Chronic diastolic (congestive) heart failure: Secondary | ICD-10-CM | POA: Diagnosis not present

## 2019-01-08 DIAGNOSIS — I110 Hypertensive heart disease with heart failure: Secondary | ICD-10-CM | POA: Diagnosis not present

## 2019-01-08 DIAGNOSIS — N179 Acute kidney failure, unspecified: Secondary | ICD-10-CM | POA: Diagnosis not present

## 2019-01-08 DIAGNOSIS — I2510 Atherosclerotic heart disease of native coronary artery without angina pectoris: Secondary | ICD-10-CM | POA: Diagnosis not present

## 2019-01-08 DIAGNOSIS — E119 Type 2 diabetes mellitus without complications: Secondary | ICD-10-CM | POA: Diagnosis not present

## 2019-01-10 DIAGNOSIS — I5032 Chronic diastolic (congestive) heart failure: Secondary | ICD-10-CM | POA: Diagnosis not present

## 2019-01-10 DIAGNOSIS — E119 Type 2 diabetes mellitus without complications: Secondary | ICD-10-CM | POA: Diagnosis not present

## 2019-01-10 DIAGNOSIS — N179 Acute kidney failure, unspecified: Secondary | ICD-10-CM | POA: Diagnosis not present

## 2019-01-10 DIAGNOSIS — M5136 Other intervertebral disc degeneration, lumbar region: Secondary | ICD-10-CM | POA: Diagnosis not present

## 2019-01-10 DIAGNOSIS — I110 Hypertensive heart disease with heart failure: Secondary | ICD-10-CM | POA: Diagnosis not present

## 2019-01-10 DIAGNOSIS — I2510 Atherosclerotic heart disease of native coronary artery without angina pectoris: Secondary | ICD-10-CM | POA: Diagnosis not present

## 2019-01-15 DIAGNOSIS — I110 Hypertensive heart disease with heart failure: Secondary | ICD-10-CM | POA: Diagnosis not present

## 2019-01-15 DIAGNOSIS — I5032 Chronic diastolic (congestive) heart failure: Secondary | ICD-10-CM | POA: Diagnosis not present

## 2019-01-15 DIAGNOSIS — E119 Type 2 diabetes mellitus without complications: Secondary | ICD-10-CM | POA: Diagnosis not present

## 2019-01-15 DIAGNOSIS — N179 Acute kidney failure, unspecified: Secondary | ICD-10-CM | POA: Diagnosis not present

## 2019-01-15 DIAGNOSIS — M5136 Other intervertebral disc degeneration, lumbar region: Secondary | ICD-10-CM | POA: Diagnosis not present

## 2019-01-15 DIAGNOSIS — I2510 Atherosclerotic heart disease of native coronary artery without angina pectoris: Secondary | ICD-10-CM | POA: Diagnosis not present

## 2019-01-19 DIAGNOSIS — M5136 Other intervertebral disc degeneration, lumbar region: Secondary | ICD-10-CM | POA: Diagnosis not present

## 2019-01-19 DIAGNOSIS — I2510 Atherosclerotic heart disease of native coronary artery without angina pectoris: Secondary | ICD-10-CM | POA: Diagnosis not present

## 2019-01-19 DIAGNOSIS — E119 Type 2 diabetes mellitus without complications: Secondary | ICD-10-CM | POA: Diagnosis not present

## 2019-01-19 DIAGNOSIS — I110 Hypertensive heart disease with heart failure: Secondary | ICD-10-CM | POA: Diagnosis not present

## 2019-01-19 DIAGNOSIS — N179 Acute kidney failure, unspecified: Secondary | ICD-10-CM | POA: Diagnosis not present

## 2019-01-19 DIAGNOSIS — I5032 Chronic diastolic (congestive) heart failure: Secondary | ICD-10-CM | POA: Diagnosis not present

## 2019-01-23 DIAGNOSIS — I110 Hypertensive heart disease with heart failure: Secondary | ICD-10-CM | POA: Diagnosis not present

## 2019-01-23 DIAGNOSIS — M5136 Other intervertebral disc degeneration, lumbar region: Secondary | ICD-10-CM | POA: Diagnosis not present

## 2019-01-23 DIAGNOSIS — N179 Acute kidney failure, unspecified: Secondary | ICD-10-CM | POA: Diagnosis not present

## 2019-01-23 DIAGNOSIS — I2510 Atherosclerotic heart disease of native coronary artery without angina pectoris: Secondary | ICD-10-CM | POA: Diagnosis not present

## 2019-01-23 DIAGNOSIS — E119 Type 2 diabetes mellitus without complications: Secondary | ICD-10-CM | POA: Diagnosis not present

## 2019-01-23 DIAGNOSIS — I5032 Chronic diastolic (congestive) heart failure: Secondary | ICD-10-CM | POA: Diagnosis not present

## 2019-01-24 DIAGNOSIS — E1165 Type 2 diabetes mellitus with hyperglycemia: Secondary | ICD-10-CM | POA: Diagnosis not present

## 2019-01-24 DIAGNOSIS — N179 Acute kidney failure, unspecified: Secondary | ICD-10-CM | POA: Diagnosis not present

## 2019-01-24 DIAGNOSIS — I110 Hypertensive heart disease with heart failure: Secondary | ICD-10-CM | POA: Diagnosis not present

## 2019-01-24 DIAGNOSIS — K210 Gastro-esophageal reflux disease with esophagitis: Secondary | ICD-10-CM | POA: Diagnosis not present

## 2019-01-24 DIAGNOSIS — M5136 Other intervertebral disc degeneration, lumbar region: Secondary | ICD-10-CM | POA: Diagnosis not present

## 2019-01-24 DIAGNOSIS — I10 Essential (primary) hypertension: Secondary | ICD-10-CM | POA: Diagnosis not present

## 2019-01-24 DIAGNOSIS — I5032 Chronic diastolic (congestive) heart failure: Secondary | ICD-10-CM | POA: Diagnosis not present

## 2019-01-24 DIAGNOSIS — E119 Type 2 diabetes mellitus without complications: Secondary | ICD-10-CM | POA: Diagnosis not present

## 2019-01-24 DIAGNOSIS — I2510 Atherosclerotic heart disease of native coronary artery without angina pectoris: Secondary | ICD-10-CM | POA: Diagnosis not present

## 2019-01-31 DIAGNOSIS — M5136 Other intervertebral disc degeneration, lumbar region: Secondary | ICD-10-CM | POA: Diagnosis not present

## 2019-01-31 DIAGNOSIS — I2510 Atherosclerotic heart disease of native coronary artery without angina pectoris: Secondary | ICD-10-CM | POA: Diagnosis not present

## 2019-01-31 DIAGNOSIS — I110 Hypertensive heart disease with heart failure: Secondary | ICD-10-CM | POA: Diagnosis not present

## 2019-01-31 DIAGNOSIS — E119 Type 2 diabetes mellitus without complications: Secondary | ICD-10-CM | POA: Diagnosis not present

## 2019-01-31 DIAGNOSIS — N179 Acute kidney failure, unspecified: Secondary | ICD-10-CM | POA: Diagnosis not present

## 2019-01-31 DIAGNOSIS — I5032 Chronic diastolic (congestive) heart failure: Secondary | ICD-10-CM | POA: Diagnosis not present

## 2019-02-04 DIAGNOSIS — Z951 Presence of aortocoronary bypass graft: Secondary | ICD-10-CM | POA: Diagnosis not present

## 2019-02-04 DIAGNOSIS — I2510 Atherosclerotic heart disease of native coronary artery without angina pectoris: Secondary | ICD-10-CM | POA: Diagnosis not present

## 2019-02-04 DIAGNOSIS — Z96651 Presence of right artificial knee joint: Secondary | ICD-10-CM | POA: Diagnosis not present

## 2019-02-04 DIAGNOSIS — E785 Hyperlipidemia, unspecified: Secondary | ICD-10-CM | POA: Diagnosis not present

## 2019-02-04 DIAGNOSIS — Z9181 History of falling: Secondary | ICD-10-CM | POA: Diagnosis not present

## 2019-02-04 DIAGNOSIS — Z7984 Long term (current) use of oral hypoglycemic drugs: Secondary | ICD-10-CM | POA: Diagnosis not present

## 2019-02-04 DIAGNOSIS — M4726 Other spondylosis with radiculopathy, lumbar region: Secondary | ICD-10-CM | POA: Diagnosis not present

## 2019-02-04 DIAGNOSIS — E119 Type 2 diabetes mellitus without complications: Secondary | ICD-10-CM | POA: Diagnosis not present

## 2019-02-04 DIAGNOSIS — I110 Hypertensive heart disease with heart failure: Secondary | ICD-10-CM | POA: Diagnosis not present

## 2019-02-04 DIAGNOSIS — M19012 Primary osteoarthritis, left shoulder: Secondary | ICD-10-CM | POA: Diagnosis not present

## 2019-02-04 DIAGNOSIS — I5032 Chronic diastolic (congestive) heart failure: Secondary | ICD-10-CM | POA: Diagnosis not present

## 2019-02-04 DIAGNOSIS — E860 Dehydration: Secondary | ICD-10-CM | POA: Diagnosis not present

## 2019-02-04 DIAGNOSIS — K449 Diaphragmatic hernia without obstruction or gangrene: Secondary | ICD-10-CM | POA: Diagnosis not present

## 2019-02-04 DIAGNOSIS — N179 Acute kidney failure, unspecified: Secondary | ICD-10-CM | POA: Diagnosis not present

## 2019-02-04 DIAGNOSIS — M5136 Other intervertebral disc degeneration, lumbar region: Secondary | ICD-10-CM | POA: Diagnosis not present

## 2019-02-04 DIAGNOSIS — K219 Gastro-esophageal reflux disease without esophagitis: Secondary | ICD-10-CM | POA: Diagnosis not present

## 2019-02-04 DIAGNOSIS — E872 Acidosis: Secondary | ICD-10-CM | POA: Diagnosis not present

## 2019-02-07 DIAGNOSIS — I110 Hypertensive heart disease with heart failure: Secondary | ICD-10-CM | POA: Diagnosis not present

## 2019-02-07 DIAGNOSIS — N179 Acute kidney failure, unspecified: Secondary | ICD-10-CM | POA: Diagnosis not present

## 2019-02-07 DIAGNOSIS — M5136 Other intervertebral disc degeneration, lumbar region: Secondary | ICD-10-CM | POA: Diagnosis not present

## 2019-02-07 DIAGNOSIS — I2510 Atherosclerotic heart disease of native coronary artery without angina pectoris: Secondary | ICD-10-CM | POA: Diagnosis not present

## 2019-02-07 DIAGNOSIS — E119 Type 2 diabetes mellitus without complications: Secondary | ICD-10-CM | POA: Diagnosis not present

## 2019-02-07 DIAGNOSIS — I5032 Chronic diastolic (congestive) heart failure: Secondary | ICD-10-CM | POA: Diagnosis not present

## 2019-02-08 DIAGNOSIS — M10242 Drug-induced gout, left hand: Secondary | ICD-10-CM | POA: Diagnosis not present

## 2019-02-08 DIAGNOSIS — I10 Essential (primary) hypertension: Secondary | ICD-10-CM | POA: Diagnosis not present

## 2019-02-08 DIAGNOSIS — I872 Venous insufficiency (chronic) (peripheral): Secondary | ICD-10-CM | POA: Diagnosis not present

## 2019-02-08 DIAGNOSIS — K210 Gastro-esophageal reflux disease with esophagitis: Secondary | ICD-10-CM | POA: Diagnosis not present

## 2019-02-08 DIAGNOSIS — E1165 Type 2 diabetes mellitus with hyperglycemia: Secondary | ICD-10-CM | POA: Diagnosis not present

## 2019-02-13 DIAGNOSIS — N179 Acute kidney failure, unspecified: Secondary | ICD-10-CM | POA: Diagnosis not present

## 2019-02-13 DIAGNOSIS — I2510 Atherosclerotic heart disease of native coronary artery without angina pectoris: Secondary | ICD-10-CM | POA: Diagnosis not present

## 2019-02-13 DIAGNOSIS — I110 Hypertensive heart disease with heart failure: Secondary | ICD-10-CM | POA: Diagnosis not present

## 2019-02-13 DIAGNOSIS — E119 Type 2 diabetes mellitus without complications: Secondary | ICD-10-CM | POA: Diagnosis not present

## 2019-02-13 DIAGNOSIS — I5032 Chronic diastolic (congestive) heart failure: Secondary | ICD-10-CM | POA: Diagnosis not present

## 2019-02-13 DIAGNOSIS — M5136 Other intervertebral disc degeneration, lumbar region: Secondary | ICD-10-CM | POA: Diagnosis not present

## 2019-02-19 DIAGNOSIS — E1151 Type 2 diabetes mellitus with diabetic peripheral angiopathy without gangrene: Secondary | ICD-10-CM | POA: Diagnosis not present

## 2019-02-19 DIAGNOSIS — E1140 Type 2 diabetes mellitus with diabetic neuropathy, unspecified: Secondary | ICD-10-CM | POA: Diagnosis not present

## 2019-02-20 DIAGNOSIS — M5136 Other intervertebral disc degeneration, lumbar region: Secondary | ICD-10-CM | POA: Diagnosis not present

## 2019-02-20 DIAGNOSIS — I5032 Chronic diastolic (congestive) heart failure: Secondary | ICD-10-CM | POA: Diagnosis not present

## 2019-02-20 DIAGNOSIS — N179 Acute kidney failure, unspecified: Secondary | ICD-10-CM | POA: Diagnosis not present

## 2019-02-20 DIAGNOSIS — E119 Type 2 diabetes mellitus without complications: Secondary | ICD-10-CM | POA: Diagnosis not present

## 2019-02-20 DIAGNOSIS — I2510 Atherosclerotic heart disease of native coronary artery without angina pectoris: Secondary | ICD-10-CM | POA: Diagnosis not present

## 2019-02-20 DIAGNOSIS — I110 Hypertensive heart disease with heart failure: Secondary | ICD-10-CM | POA: Diagnosis not present

## 2019-03-02 DIAGNOSIS — E1165 Type 2 diabetes mellitus with hyperglycemia: Secondary | ICD-10-CM | POA: Diagnosis not present

## 2019-03-02 DIAGNOSIS — E119 Type 2 diabetes mellitus without complications: Secondary | ICD-10-CM | POA: Diagnosis not present

## 2019-03-02 DIAGNOSIS — I2510 Atherosclerotic heart disease of native coronary artery without angina pectoris: Secondary | ICD-10-CM | POA: Diagnosis not present

## 2019-03-02 DIAGNOSIS — N179 Acute kidney failure, unspecified: Secondary | ICD-10-CM | POA: Diagnosis not present

## 2019-03-02 DIAGNOSIS — I110 Hypertensive heart disease with heart failure: Secondary | ICD-10-CM | POA: Diagnosis not present

## 2019-03-02 DIAGNOSIS — I10 Essential (primary) hypertension: Secondary | ICD-10-CM | POA: Diagnosis not present

## 2019-03-02 DIAGNOSIS — K210 Gastro-esophageal reflux disease with esophagitis: Secondary | ICD-10-CM | POA: Diagnosis not present

## 2019-03-02 DIAGNOSIS — I5032 Chronic diastolic (congestive) heart failure: Secondary | ICD-10-CM | POA: Diagnosis not present

## 2019-03-02 DIAGNOSIS — M5136 Other intervertebral disc degeneration, lumbar region: Secondary | ICD-10-CM | POA: Diagnosis not present

## 2019-03-06 DIAGNOSIS — E119 Type 2 diabetes mellitus without complications: Secondary | ICD-10-CM | POA: Diagnosis not present

## 2019-03-06 DIAGNOSIS — Z7984 Long term (current) use of oral hypoglycemic drugs: Secondary | ICD-10-CM | POA: Diagnosis not present

## 2019-03-06 DIAGNOSIS — L89312 Pressure ulcer of right buttock, stage 2: Secondary | ICD-10-CM | POA: Diagnosis not present

## 2019-03-06 DIAGNOSIS — M19012 Primary osteoarthritis, left shoulder: Secondary | ICD-10-CM | POA: Diagnosis not present

## 2019-03-06 DIAGNOSIS — Z951 Presence of aortocoronary bypass graft: Secondary | ICD-10-CM | POA: Diagnosis not present

## 2019-03-06 DIAGNOSIS — K449 Diaphragmatic hernia without obstruction or gangrene: Secondary | ICD-10-CM | POA: Diagnosis not present

## 2019-03-06 DIAGNOSIS — M4726 Other spondylosis with radiculopathy, lumbar region: Secondary | ICD-10-CM | POA: Diagnosis not present

## 2019-03-06 DIAGNOSIS — Z9181 History of falling: Secondary | ICD-10-CM | POA: Diagnosis not present

## 2019-03-06 DIAGNOSIS — M5136 Other intervertebral disc degeneration, lumbar region: Secondary | ICD-10-CM | POA: Diagnosis not present

## 2019-03-06 DIAGNOSIS — E785 Hyperlipidemia, unspecified: Secondary | ICD-10-CM | POA: Diagnosis not present

## 2019-03-06 DIAGNOSIS — K219 Gastro-esophageal reflux disease without esophagitis: Secondary | ICD-10-CM | POA: Diagnosis not present

## 2019-03-06 DIAGNOSIS — I5032 Chronic diastolic (congestive) heart failure: Secondary | ICD-10-CM | POA: Diagnosis not present

## 2019-03-06 DIAGNOSIS — N179 Acute kidney failure, unspecified: Secondary | ICD-10-CM | POA: Diagnosis not present

## 2019-03-06 DIAGNOSIS — I110 Hypertensive heart disease with heart failure: Secondary | ICD-10-CM | POA: Diagnosis not present

## 2019-03-06 DIAGNOSIS — I2510 Atherosclerotic heart disease of native coronary artery without angina pectoris: Secondary | ICD-10-CM | POA: Diagnosis not present

## 2019-03-06 DIAGNOSIS — Z96651 Presence of right artificial knee joint: Secondary | ICD-10-CM | POA: Diagnosis not present

## 2019-03-13 DIAGNOSIS — L89312 Pressure ulcer of right buttock, stage 2: Secondary | ICD-10-CM | POA: Diagnosis not present

## 2019-03-13 DIAGNOSIS — N179 Acute kidney failure, unspecified: Secondary | ICD-10-CM | POA: Diagnosis not present

## 2019-03-13 DIAGNOSIS — I110 Hypertensive heart disease with heart failure: Secondary | ICD-10-CM | POA: Diagnosis not present

## 2019-03-13 DIAGNOSIS — E119 Type 2 diabetes mellitus without complications: Secondary | ICD-10-CM | POA: Diagnosis not present

## 2019-03-13 DIAGNOSIS — I2510 Atherosclerotic heart disease of native coronary artery without angina pectoris: Secondary | ICD-10-CM | POA: Diagnosis not present

## 2019-03-13 DIAGNOSIS — I5032 Chronic diastolic (congestive) heart failure: Secondary | ICD-10-CM | POA: Diagnosis not present

## 2019-03-20 DIAGNOSIS — L89312 Pressure ulcer of right buttock, stage 2: Secondary | ICD-10-CM | POA: Diagnosis not present

## 2019-03-20 DIAGNOSIS — N179 Acute kidney failure, unspecified: Secondary | ICD-10-CM | POA: Diagnosis not present

## 2019-03-20 DIAGNOSIS — I2510 Atherosclerotic heart disease of native coronary artery without angina pectoris: Secondary | ICD-10-CM | POA: Diagnosis not present

## 2019-03-20 DIAGNOSIS — I5032 Chronic diastolic (congestive) heart failure: Secondary | ICD-10-CM | POA: Diagnosis not present

## 2019-03-20 DIAGNOSIS — I110 Hypertensive heart disease with heart failure: Secondary | ICD-10-CM | POA: Diagnosis not present

## 2019-03-20 DIAGNOSIS — E119 Type 2 diabetes mellitus without complications: Secondary | ICD-10-CM | POA: Diagnosis not present

## 2019-03-27 DIAGNOSIS — I2510 Atherosclerotic heart disease of native coronary artery without angina pectoris: Secondary | ICD-10-CM | POA: Diagnosis not present

## 2019-03-27 DIAGNOSIS — N179 Acute kidney failure, unspecified: Secondary | ICD-10-CM | POA: Diagnosis not present

## 2019-03-27 DIAGNOSIS — L89312 Pressure ulcer of right buttock, stage 2: Secondary | ICD-10-CM | POA: Diagnosis not present

## 2019-03-27 DIAGNOSIS — I110 Hypertensive heart disease with heart failure: Secondary | ICD-10-CM | POA: Diagnosis not present

## 2019-03-27 DIAGNOSIS — I5032 Chronic diastolic (congestive) heart failure: Secondary | ICD-10-CM | POA: Diagnosis not present

## 2019-03-27 DIAGNOSIS — E119 Type 2 diabetes mellitus without complications: Secondary | ICD-10-CM | POA: Diagnosis not present

## 2019-04-02 DIAGNOSIS — K210 Gastro-esophageal reflux disease with esophagitis: Secondary | ICD-10-CM | POA: Diagnosis not present

## 2019-04-02 DIAGNOSIS — I10 Essential (primary) hypertension: Secondary | ICD-10-CM | POA: Diagnosis not present

## 2019-04-02 DIAGNOSIS — E1165 Type 2 diabetes mellitus with hyperglycemia: Secondary | ICD-10-CM | POA: Diagnosis not present

## 2019-04-03 DIAGNOSIS — I2510 Atherosclerotic heart disease of native coronary artery without angina pectoris: Secondary | ICD-10-CM | POA: Diagnosis not present

## 2019-04-03 DIAGNOSIS — E119 Type 2 diabetes mellitus without complications: Secondary | ICD-10-CM | POA: Diagnosis not present

## 2019-04-03 DIAGNOSIS — I110 Hypertensive heart disease with heart failure: Secondary | ICD-10-CM | POA: Diagnosis not present

## 2019-04-03 DIAGNOSIS — L89312 Pressure ulcer of right buttock, stage 2: Secondary | ICD-10-CM | POA: Diagnosis not present

## 2019-04-03 DIAGNOSIS — I5032 Chronic diastolic (congestive) heart failure: Secondary | ICD-10-CM | POA: Diagnosis not present

## 2019-04-03 DIAGNOSIS — N179 Acute kidney failure, unspecified: Secondary | ICD-10-CM | POA: Diagnosis not present

## 2019-04-05 DIAGNOSIS — N179 Acute kidney failure, unspecified: Secondary | ICD-10-CM | POA: Diagnosis not present

## 2019-04-05 DIAGNOSIS — Z7984 Long term (current) use of oral hypoglycemic drugs: Secondary | ICD-10-CM | POA: Diagnosis not present

## 2019-04-05 DIAGNOSIS — M4726 Other spondylosis with radiculopathy, lumbar region: Secondary | ICD-10-CM | POA: Diagnosis not present

## 2019-04-05 DIAGNOSIS — I2510 Atherosclerotic heart disease of native coronary artery without angina pectoris: Secondary | ICD-10-CM | POA: Diagnosis not present

## 2019-04-05 DIAGNOSIS — K449 Diaphragmatic hernia without obstruction or gangrene: Secondary | ICD-10-CM | POA: Diagnosis not present

## 2019-04-05 DIAGNOSIS — Z9181 History of falling: Secondary | ICD-10-CM | POA: Diagnosis not present

## 2019-04-05 DIAGNOSIS — Z951 Presence of aortocoronary bypass graft: Secondary | ICD-10-CM | POA: Diagnosis not present

## 2019-04-05 DIAGNOSIS — Z96651 Presence of right artificial knee joint: Secondary | ICD-10-CM | POA: Diagnosis not present

## 2019-04-05 DIAGNOSIS — E119 Type 2 diabetes mellitus without complications: Secondary | ICD-10-CM | POA: Diagnosis not present

## 2019-04-05 DIAGNOSIS — I5032 Chronic diastolic (congestive) heart failure: Secondary | ICD-10-CM | POA: Diagnosis not present

## 2019-04-05 DIAGNOSIS — M19012 Primary osteoarthritis, left shoulder: Secondary | ICD-10-CM | POA: Diagnosis not present

## 2019-04-05 DIAGNOSIS — E785 Hyperlipidemia, unspecified: Secondary | ICD-10-CM | POA: Diagnosis not present

## 2019-04-05 DIAGNOSIS — K219 Gastro-esophageal reflux disease without esophagitis: Secondary | ICD-10-CM | POA: Diagnosis not present

## 2019-04-05 DIAGNOSIS — L89312 Pressure ulcer of right buttock, stage 2: Secondary | ICD-10-CM | POA: Diagnosis not present

## 2019-04-05 DIAGNOSIS — M5136 Other intervertebral disc degeneration, lumbar region: Secondary | ICD-10-CM | POA: Diagnosis not present

## 2019-04-05 DIAGNOSIS — I110 Hypertensive heart disease with heart failure: Secondary | ICD-10-CM | POA: Diagnosis not present

## 2019-04-17 DIAGNOSIS — E119 Type 2 diabetes mellitus without complications: Secondary | ICD-10-CM | POA: Diagnosis not present

## 2019-04-17 DIAGNOSIS — I5032 Chronic diastolic (congestive) heart failure: Secondary | ICD-10-CM | POA: Diagnosis not present

## 2019-04-17 DIAGNOSIS — N179 Acute kidney failure, unspecified: Secondary | ICD-10-CM | POA: Diagnosis not present

## 2019-04-17 DIAGNOSIS — I110 Hypertensive heart disease with heart failure: Secondary | ICD-10-CM | POA: Diagnosis not present

## 2019-04-17 DIAGNOSIS — L89312 Pressure ulcer of right buttock, stage 2: Secondary | ICD-10-CM | POA: Diagnosis not present

## 2019-04-17 DIAGNOSIS — I2510 Atherosclerotic heart disease of native coronary artery without angina pectoris: Secondary | ICD-10-CM | POA: Diagnosis not present

## 2019-04-23 DIAGNOSIS — I110 Hypertensive heart disease with heart failure: Secondary | ICD-10-CM | POA: Diagnosis not present

## 2019-04-23 DIAGNOSIS — I5032 Chronic diastolic (congestive) heart failure: Secondary | ICD-10-CM | POA: Diagnosis not present

## 2019-04-23 DIAGNOSIS — I2510 Atherosclerotic heart disease of native coronary artery without angina pectoris: Secondary | ICD-10-CM | POA: Diagnosis not present

## 2019-04-23 DIAGNOSIS — E119 Type 2 diabetes mellitus without complications: Secondary | ICD-10-CM | POA: Diagnosis not present

## 2019-04-23 DIAGNOSIS — N179 Acute kidney failure, unspecified: Secondary | ICD-10-CM | POA: Diagnosis not present

## 2019-04-23 DIAGNOSIS — L89312 Pressure ulcer of right buttock, stage 2: Secondary | ICD-10-CM | POA: Diagnosis not present

## 2019-04-30 DIAGNOSIS — L89312 Pressure ulcer of right buttock, stage 2: Secondary | ICD-10-CM | POA: Diagnosis not present

## 2019-04-30 DIAGNOSIS — N179 Acute kidney failure, unspecified: Secondary | ICD-10-CM | POA: Diagnosis not present

## 2019-04-30 DIAGNOSIS — I5032 Chronic diastolic (congestive) heart failure: Secondary | ICD-10-CM | POA: Diagnosis not present

## 2019-04-30 DIAGNOSIS — E119 Type 2 diabetes mellitus without complications: Secondary | ICD-10-CM | POA: Diagnosis not present

## 2019-04-30 DIAGNOSIS — I110 Hypertensive heart disease with heart failure: Secondary | ICD-10-CM | POA: Diagnosis not present

## 2019-04-30 DIAGNOSIS — I2510 Atherosclerotic heart disease of native coronary artery without angina pectoris: Secondary | ICD-10-CM | POA: Diagnosis not present

## 2019-05-03 DIAGNOSIS — Z9889 Other specified postprocedural states: Secondary | ICD-10-CM | POA: Diagnosis not present

## 2019-05-03 DIAGNOSIS — E119 Type 2 diabetes mellitus without complications: Secondary | ICD-10-CM | POA: Diagnosis not present

## 2019-05-03 DIAGNOSIS — I517 Cardiomegaly: Secondary | ICD-10-CM | POA: Diagnosis not present

## 2019-05-03 DIAGNOSIS — N390 Urinary tract infection, site not specified: Secondary | ICD-10-CM | POA: Diagnosis not present

## 2019-05-03 DIAGNOSIS — R918 Other nonspecific abnormal finding of lung field: Secondary | ICD-10-CM | POA: Diagnosis not present

## 2019-05-03 DIAGNOSIS — Z952 Presence of prosthetic heart valve: Secondary | ICD-10-CM | POA: Diagnosis not present

## 2019-05-03 DIAGNOSIS — R55 Syncope and collapse: Secondary | ICD-10-CM | POA: Diagnosis not present

## 2019-05-03 DIAGNOSIS — I5032 Chronic diastolic (congestive) heart failure: Secondary | ICD-10-CM | POA: Diagnosis not present

## 2019-05-03 DIAGNOSIS — I700 Atherosclerosis of aorta: Secondary | ICD-10-CM | POA: Diagnosis not present

## 2019-05-04 DIAGNOSIS — Z952 Presence of prosthetic heart valve: Secondary | ICD-10-CM | POA: Diagnosis not present

## 2019-05-04 DIAGNOSIS — R55 Syncope and collapse: Secondary | ICD-10-CM | POA: Diagnosis not present

## 2019-05-04 DIAGNOSIS — E119 Type 2 diabetes mellitus without complications: Secondary | ICD-10-CM | POA: Diagnosis not present

## 2019-05-04 DIAGNOSIS — I5032 Chronic diastolic (congestive) heart failure: Secondary | ICD-10-CM | POA: Diagnosis not present

## 2019-05-04 DIAGNOSIS — N390 Urinary tract infection, site not specified: Secondary | ICD-10-CM | POA: Diagnosis not present

## 2019-05-07 DIAGNOSIS — Z794 Long term (current) use of insulin: Secondary | ICD-10-CM | POA: Diagnosis not present

## 2019-05-07 DIAGNOSIS — I509 Heart failure, unspecified: Secondary | ICD-10-CM | POA: Diagnosis not present

## 2019-05-07 DIAGNOSIS — Z952 Presence of prosthetic heart valve: Secondary | ICD-10-CM | POA: Diagnosis not present

## 2019-05-07 DIAGNOSIS — N958 Other specified menopausal and perimenopausal disorders: Secondary | ICD-10-CM | POA: Diagnosis not present

## 2019-05-07 DIAGNOSIS — Z209 Contact with and (suspected) exposure to unspecified communicable disease: Secondary | ICD-10-CM | POA: Diagnosis not present

## 2019-05-07 DIAGNOSIS — R262 Difficulty in walking, not elsewhere classified: Secondary | ICD-10-CM | POA: Diagnosis not present

## 2019-05-07 DIAGNOSIS — E119 Type 2 diabetes mellitus without complications: Secondary | ICD-10-CM | POA: Diagnosis present

## 2019-05-07 DIAGNOSIS — R1310 Dysphagia, unspecified: Secondary | ICD-10-CM | POA: Diagnosis not present

## 2019-05-07 DIAGNOSIS — Z7401 Bed confinement status: Secondary | ICD-10-CM | POA: Diagnosis not present

## 2019-05-07 DIAGNOSIS — B9620 Unspecified Escherichia coli [E. coli] as the cause of diseases classified elsewhere: Secondary | ICD-10-CM | POA: Diagnosis present

## 2019-05-07 DIAGNOSIS — E785 Hyperlipidemia, unspecified: Secondary | ICD-10-CM | POA: Diagnosis present

## 2019-05-07 DIAGNOSIS — Z7982 Long term (current) use of aspirin: Secondary | ICD-10-CM | POA: Diagnosis not present

## 2019-05-07 DIAGNOSIS — M1711 Unilateral primary osteoarthritis, right knee: Secondary | ICD-10-CM | POA: Diagnosis not present

## 2019-05-07 DIAGNOSIS — K449 Diaphragmatic hernia without obstruction or gangrene: Secondary | ICD-10-CM | POA: Diagnosis present

## 2019-05-07 DIAGNOSIS — N390 Urinary tract infection, site not specified: Secondary | ICD-10-CM | POA: Diagnosis not present

## 2019-05-07 DIAGNOSIS — M6281 Muscle weakness (generalized): Secondary | ICD-10-CM | POA: Diagnosis not present

## 2019-05-07 DIAGNOSIS — R55 Syncope and collapse: Secondary | ICD-10-CM | POA: Diagnosis not present

## 2019-05-07 DIAGNOSIS — I2510 Atherosclerotic heart disease of native coronary artery without angina pectoris: Secondary | ICD-10-CM | POA: Diagnosis not present

## 2019-05-07 DIAGNOSIS — Z9181 History of falling: Secondary | ICD-10-CM | POA: Diagnosis not present

## 2019-05-07 DIAGNOSIS — I350 Nonrheumatic aortic (valve) stenosis: Secondary | ICD-10-CM | POA: Diagnosis not present

## 2019-05-07 DIAGNOSIS — Z780 Asymptomatic menopausal state: Secondary | ICD-10-CM | POA: Diagnosis not present

## 2019-05-07 DIAGNOSIS — Z96641 Presence of right artificial hip joint: Secondary | ICD-10-CM | POA: Diagnosis present

## 2019-05-07 DIAGNOSIS — R531 Weakness: Secondary | ICD-10-CM | POA: Diagnosis not present

## 2019-05-07 DIAGNOSIS — I10 Essential (primary) hypertension: Secondary | ICD-10-CM | POA: Diagnosis not present

## 2019-05-07 DIAGNOSIS — Z1159 Encounter for screening for other viral diseases: Secondary | ICD-10-CM | POA: Diagnosis not present

## 2019-05-07 DIAGNOSIS — I5032 Chronic diastolic (congestive) heart failure: Secondary | ICD-10-CM | POA: Diagnosis not present

## 2019-05-11 DIAGNOSIS — N390 Urinary tract infection, site not specified: Secondary | ICD-10-CM | POA: Diagnosis not present

## 2019-05-11 DIAGNOSIS — R1310 Dysphagia, unspecified: Secondary | ICD-10-CM | POA: Diagnosis not present

## 2019-05-11 DIAGNOSIS — I10 Essential (primary) hypertension: Secondary | ICD-10-CM | POA: Diagnosis not present

## 2019-05-11 DIAGNOSIS — Z209 Contact with and (suspected) exposure to unspecified communicable disease: Secondary | ICD-10-CM | POA: Diagnosis not present

## 2019-05-11 DIAGNOSIS — E785 Hyperlipidemia, unspecified: Secondary | ICD-10-CM | POA: Diagnosis not present

## 2019-05-11 DIAGNOSIS — I350 Nonrheumatic aortic (valve) stenosis: Secondary | ICD-10-CM | POA: Diagnosis not present

## 2019-05-11 DIAGNOSIS — I2510 Atherosclerotic heart disease of native coronary artery without angina pectoris: Secondary | ICD-10-CM | POA: Diagnosis not present

## 2019-05-11 DIAGNOSIS — R531 Weakness: Secondary | ICD-10-CM | POA: Diagnosis not present

## 2019-05-11 DIAGNOSIS — N958 Other specified menopausal and perimenopausal disorders: Secondary | ICD-10-CM | POA: Diagnosis not present

## 2019-05-11 DIAGNOSIS — M6281 Muscle weakness (generalized): Secondary | ICD-10-CM | POA: Diagnosis not present

## 2019-05-11 DIAGNOSIS — E119 Type 2 diabetes mellitus without complications: Secondary | ICD-10-CM | POA: Diagnosis not present

## 2019-05-11 DIAGNOSIS — I509 Heart failure, unspecified: Secondary | ICD-10-CM | POA: Diagnosis not present

## 2019-05-11 DIAGNOSIS — K449 Diaphragmatic hernia without obstruction or gangrene: Secondary | ICD-10-CM | POA: Diagnosis not present

## 2019-05-11 DIAGNOSIS — R262 Difficulty in walking, not elsewhere classified: Secondary | ICD-10-CM | POA: Diagnosis not present

## 2019-05-11 DIAGNOSIS — R5383 Other fatigue: Secondary | ICD-10-CM | POA: Diagnosis not present

## 2019-05-11 DIAGNOSIS — Z9181 History of falling: Secondary | ICD-10-CM | POA: Diagnosis not present

## 2019-05-11 DIAGNOSIS — M1711 Unilateral primary osteoarthritis, right knee: Secondary | ICD-10-CM | POA: Diagnosis not present

## 2019-05-11 DIAGNOSIS — R350 Frequency of micturition: Secondary | ICD-10-CM | POA: Diagnosis not present

## 2019-05-11 DIAGNOSIS — R55 Syncope and collapse: Secondary | ICD-10-CM | POA: Diagnosis not present

## 2019-05-11 DIAGNOSIS — Z7401 Bed confinement status: Secondary | ICD-10-CM | POA: Diagnosis not present

## 2019-05-11 DIAGNOSIS — I5032 Chronic diastolic (congestive) heart failure: Secondary | ICD-10-CM | POA: Diagnosis not present

## 2019-05-11 DIAGNOSIS — Z952 Presence of prosthetic heart valve: Secondary | ICD-10-CM | POA: Diagnosis not present

## 2019-05-11 DIAGNOSIS — K5900 Constipation, unspecified: Secondary | ICD-10-CM | POA: Diagnosis not present

## 2019-05-15 DIAGNOSIS — R55 Syncope and collapse: Secondary | ICD-10-CM | POA: Diagnosis not present

## 2019-05-15 DIAGNOSIS — R531 Weakness: Secondary | ICD-10-CM | POA: Diagnosis not present

## 2019-05-15 DIAGNOSIS — I509 Heart failure, unspecified: Secondary | ICD-10-CM | POA: Diagnosis not present

## 2019-05-16 DIAGNOSIS — R531 Weakness: Secondary | ICD-10-CM | POA: Diagnosis not present

## 2019-05-16 DIAGNOSIS — R55 Syncope and collapse: Secondary | ICD-10-CM | POA: Diagnosis not present

## 2019-05-16 DIAGNOSIS — I350 Nonrheumatic aortic (valve) stenosis: Secondary | ICD-10-CM | POA: Diagnosis not present

## 2019-05-22 DIAGNOSIS — I10 Essential (primary) hypertension: Secondary | ICD-10-CM | POA: Diagnosis not present

## 2019-05-22 DIAGNOSIS — I509 Heart failure, unspecified: Secondary | ICD-10-CM | POA: Diagnosis not present

## 2019-05-22 DIAGNOSIS — I350 Nonrheumatic aortic (valve) stenosis: Secondary | ICD-10-CM | POA: Diagnosis not present

## 2019-05-22 DIAGNOSIS — E785 Hyperlipidemia, unspecified: Secondary | ICD-10-CM | POA: Diagnosis not present

## 2019-05-28 DIAGNOSIS — K5900 Constipation, unspecified: Secondary | ICD-10-CM | POA: Diagnosis not present

## 2019-05-30 DIAGNOSIS — I509 Heart failure, unspecified: Secondary | ICD-10-CM | POA: Diagnosis not present

## 2019-05-30 DIAGNOSIS — K5900 Constipation, unspecified: Secondary | ICD-10-CM | POA: Diagnosis not present

## 2019-06-03 DIAGNOSIS — R5383 Other fatigue: Secondary | ICD-10-CM | POA: Diagnosis not present

## 2019-06-05 DIAGNOSIS — R350 Frequency of micturition: Secondary | ICD-10-CM | POA: Diagnosis not present

## 2019-06-08 DIAGNOSIS — R350 Frequency of micturition: Secondary | ICD-10-CM | POA: Diagnosis not present

## 2019-06-08 DIAGNOSIS — Z20828 Contact with and (suspected) exposure to other viral communicable diseases: Secondary | ICD-10-CM | POA: Diagnosis not present

## 2019-06-14 DIAGNOSIS — E785 Hyperlipidemia, unspecified: Secondary | ICD-10-CM | POA: Diagnosis not present

## 2019-06-14 DIAGNOSIS — I350 Nonrheumatic aortic (valve) stenosis: Secondary | ICD-10-CM | POA: Diagnosis not present

## 2019-06-14 DIAGNOSIS — N390 Urinary tract infection, site not specified: Secondary | ICD-10-CM | POA: Diagnosis not present

## 2019-06-14 DIAGNOSIS — I509 Heart failure, unspecified: Secondary | ICD-10-CM | POA: Diagnosis not present

## 2019-06-15 DIAGNOSIS — R5383 Other fatigue: Secondary | ICD-10-CM | POA: Diagnosis not present

## 2019-06-17 DIAGNOSIS — I700 Atherosclerosis of aorta: Secondary | ICD-10-CM | POA: Diagnosis not present

## 2019-06-17 DIAGNOSIS — I517 Cardiomegaly: Secondary | ICD-10-CM | POA: Diagnosis not present

## 2019-06-17 DIAGNOSIS — I509 Heart failure, unspecified: Secondary | ICD-10-CM | POA: Diagnosis not present

## 2019-06-18 DIAGNOSIS — E785 Hyperlipidemia, unspecified: Secondary | ICD-10-CM | POA: Diagnosis not present

## 2019-06-18 DIAGNOSIS — K449 Diaphragmatic hernia without obstruction or gangrene: Secondary | ICD-10-CM | POA: Diagnosis not present

## 2019-06-18 DIAGNOSIS — Z9181 History of falling: Secondary | ICD-10-CM | POA: Diagnosis not present

## 2019-06-18 DIAGNOSIS — Z952 Presence of prosthetic heart valve: Secondary | ICD-10-CM | POA: Diagnosis not present

## 2019-06-18 DIAGNOSIS — R5383 Other fatigue: Secondary | ICD-10-CM | POA: Diagnosis not present

## 2019-06-18 DIAGNOSIS — R509 Fever, unspecified: Secondary | ICD-10-CM | POA: Diagnosis not present

## 2019-06-18 DIAGNOSIS — M6281 Muscle weakness (generalized): Secondary | ICD-10-CM | POA: Diagnosis not present

## 2019-06-18 DIAGNOSIS — U071 COVID-19: Secondary | ICD-10-CM | POA: Diagnosis not present

## 2019-06-18 DIAGNOSIS — Z20828 Contact with and (suspected) exposure to other viral communicable diseases: Secondary | ICD-10-CM | POA: Diagnosis not present

## 2019-06-18 DIAGNOSIS — J189 Pneumonia, unspecified organism: Secondary | ICD-10-CM | POA: Diagnosis not present

## 2019-06-18 DIAGNOSIS — E119 Type 2 diabetes mellitus without complications: Secondary | ICD-10-CM | POA: Diagnosis not present

## 2019-06-18 DIAGNOSIS — M1711 Unilateral primary osteoarthritis, right knee: Secondary | ICD-10-CM | POA: Diagnosis not present

## 2019-06-18 DIAGNOSIS — Z7189 Other specified counseling: Secondary | ICD-10-CM | POA: Diagnosis not present

## 2019-06-18 DIAGNOSIS — N390 Urinary tract infection, site not specified: Secondary | ICD-10-CM | POA: Diagnosis not present

## 2019-06-18 DIAGNOSIS — N958 Other specified menopausal and perimenopausal disorders: Secondary | ICD-10-CM | POA: Diagnosis not present

## 2019-06-18 DIAGNOSIS — I5032 Chronic diastolic (congestive) heart failure: Secondary | ICD-10-CM | POA: Diagnosis not present

## 2019-06-18 DIAGNOSIS — R262 Difficulty in walking, not elsewhere classified: Secondary | ICD-10-CM | POA: Diagnosis not present

## 2019-06-18 DIAGNOSIS — I509 Heart failure, unspecified: Secondary | ICD-10-CM | POA: Diagnosis not present

## 2019-06-18 DIAGNOSIS — I2510 Atherosclerotic heart disease of native coronary artery without angina pectoris: Secondary | ICD-10-CM | POA: Diagnosis not present

## 2019-06-18 DIAGNOSIS — R55 Syncope and collapse: Secondary | ICD-10-CM | POA: Diagnosis not present

## 2019-06-18 DIAGNOSIS — I10 Essential (primary) hypertension: Secondary | ICD-10-CM | POA: Diagnosis not present

## 2019-06-18 DIAGNOSIS — I350 Nonrheumatic aortic (valve) stenosis: Secondary | ICD-10-CM | POA: Diagnosis not present

## 2019-06-18 DIAGNOSIS — R1310 Dysphagia, unspecified: Secondary | ICD-10-CM | POA: Diagnosis not present

## 2019-06-20 DIAGNOSIS — Z7189 Other specified counseling: Secondary | ICD-10-CM | POA: Diagnosis not present

## 2019-06-20 DIAGNOSIS — I509 Heart failure, unspecified: Secondary | ICD-10-CM | POA: Diagnosis not present

## 2019-06-20 DIAGNOSIS — U071 COVID-19: Secondary | ICD-10-CM | POA: Diagnosis not present

## 2019-06-22 ENCOUNTER — Other Ambulatory Visit: Payer: Self-pay

## 2019-06-24 DIAGNOSIS — R509 Fever, unspecified: Secondary | ICD-10-CM | POA: Diagnosis not present

## 2019-06-25 DIAGNOSIS — U071 COVID-19: Secondary | ICD-10-CM | POA: Diagnosis not present

## 2019-06-25 DIAGNOSIS — J189 Pneumonia, unspecified organism: Secondary | ICD-10-CM | POA: Diagnosis not present

## 2019-06-25 DIAGNOSIS — R509 Fever, unspecified: Secondary | ICD-10-CM | POA: Diagnosis not present

## 2019-06-27 DIAGNOSIS — U071 COVID-19: Secondary | ICD-10-CM | POA: Diagnosis not present

## 2019-06-27 DIAGNOSIS — R5383 Other fatigue: Secondary | ICD-10-CM | POA: Diagnosis not present

## 2019-06-29 DIAGNOSIS — I350 Nonrheumatic aortic (valve) stenosis: Secondary | ICD-10-CM | POA: Diagnosis not present

## 2019-06-29 DIAGNOSIS — I509 Heart failure, unspecified: Secondary | ICD-10-CM | POA: Diagnosis not present

## 2019-07-04 DIAGNOSIS — I509 Heart failure, unspecified: Secondary | ICD-10-CM | POA: Diagnosis not present

## 2019-07-04 DIAGNOSIS — I10 Essential (primary) hypertension: Secondary | ICD-10-CM | POA: Diagnosis not present

## 2019-07-04 DIAGNOSIS — I350 Nonrheumatic aortic (valve) stenosis: Secondary | ICD-10-CM | POA: Diagnosis not present

## 2019-07-04 DIAGNOSIS — U071 COVID-19: Secondary | ICD-10-CM | POA: Diagnosis not present

## 2019-07-12 DIAGNOSIS — I509 Heart failure, unspecified: Secondary | ICD-10-CM | POA: Diagnosis not present

## 2019-07-12 DIAGNOSIS — I10 Essential (primary) hypertension: Secondary | ICD-10-CM | POA: Diagnosis not present

## 2019-07-12 DIAGNOSIS — R531 Weakness: Secondary | ICD-10-CM | POA: Diagnosis not present

## 2019-07-12 DIAGNOSIS — N390 Urinary tract infection, site not specified: Secondary | ICD-10-CM | POA: Diagnosis not present

## 2019-07-27 IMAGING — MG DIGITAL DIAGNOSTIC UNILATERAL RIGHT MAMMOGRAM WITH TOMO AND CAD
4 series · 4 of 12 positions shown · non-contrast
Comparison: Previous exams including most recent bilateral
mammogram dated 12/27/2017.

CLINICAL DATA: [AGE] female with a history of RIGHT breast
cancer diagnosed on 09/08/2016. Follow-up imaging requested.

EXAM:
DIGITAL DIAGNOSTIC RIGHT MAMMOGRAM WITH CAD AND TOMO
ULTRASOUND RIGHT BREAST

[R MLO synth-2D]
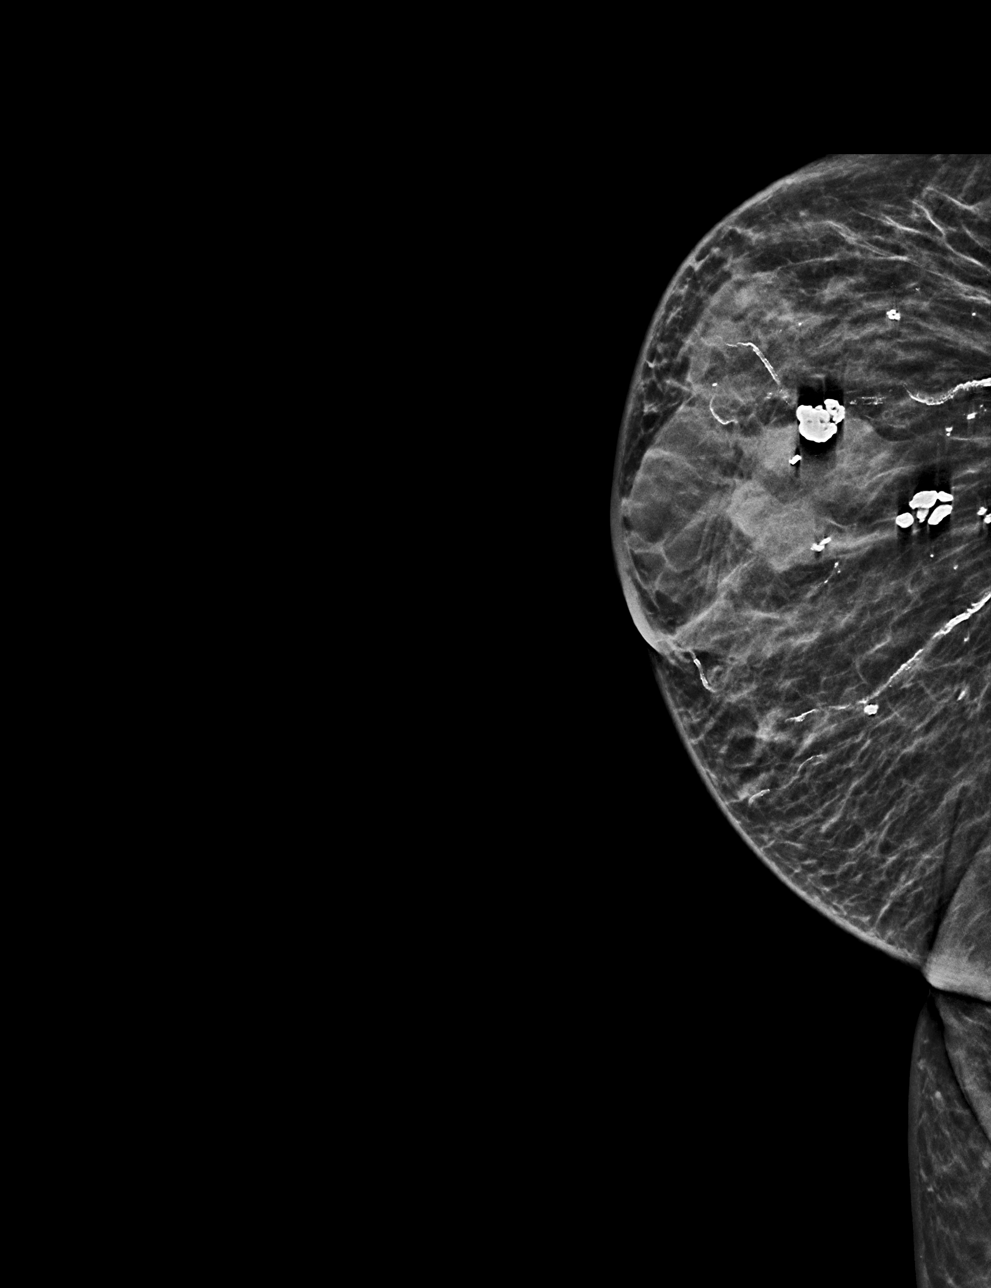

[R CC synth-2D]
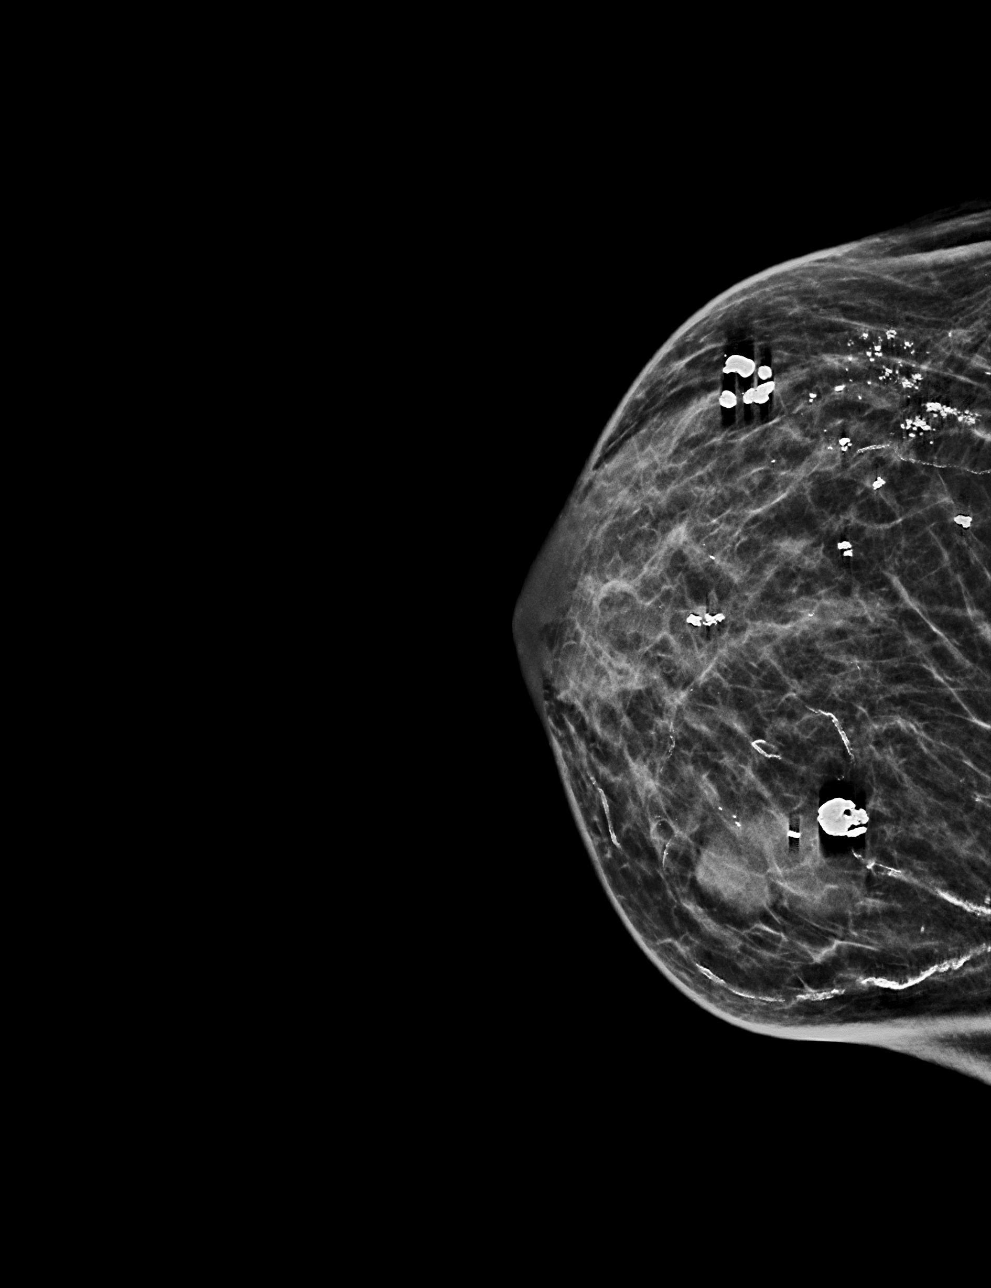

[R MLO tomo · tomo slice 20/39.0]
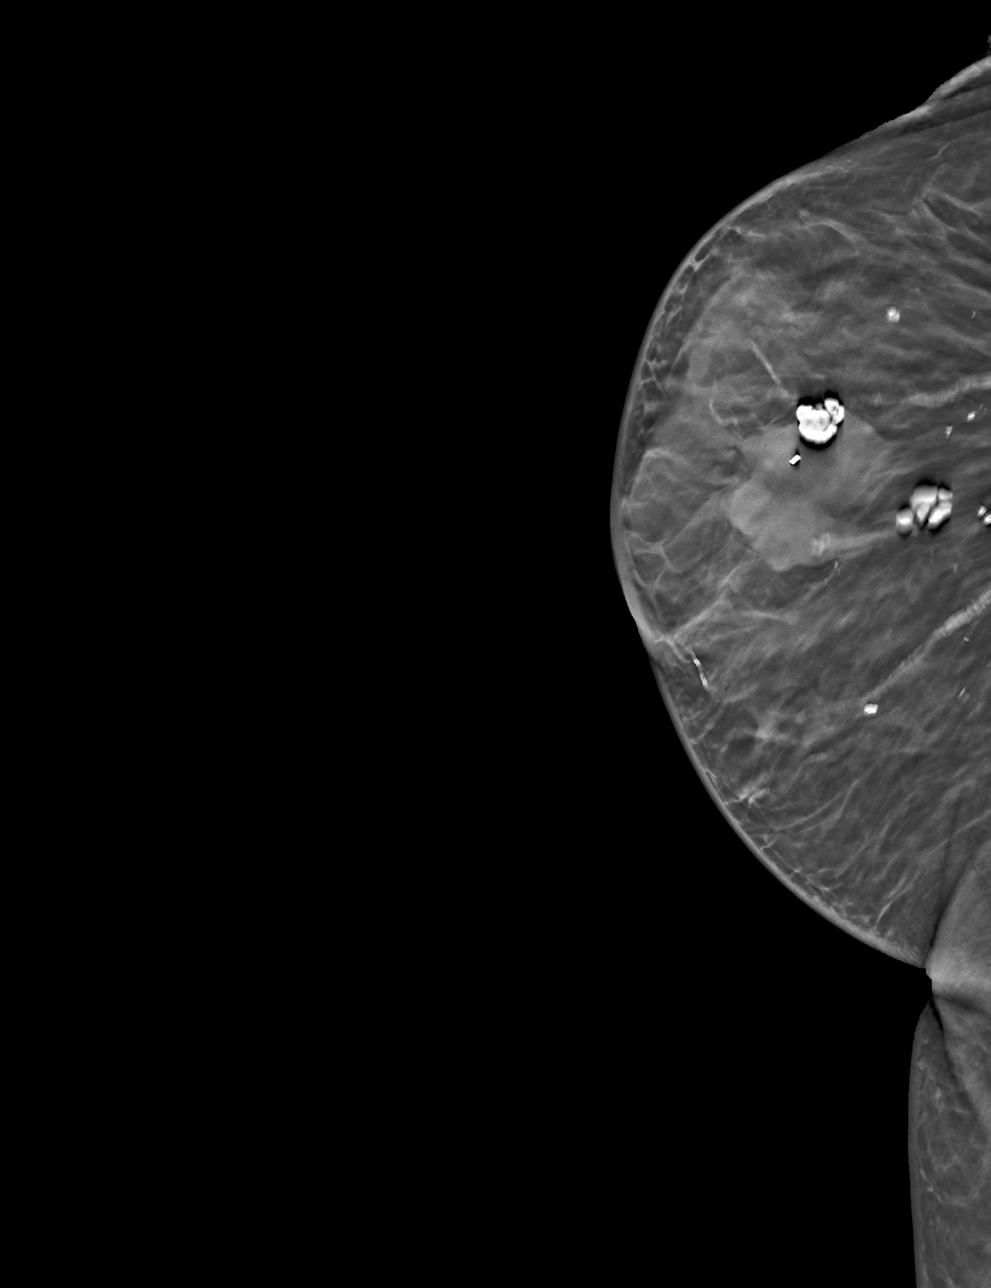

[R CC tomo · tomo slice 18/35.0]
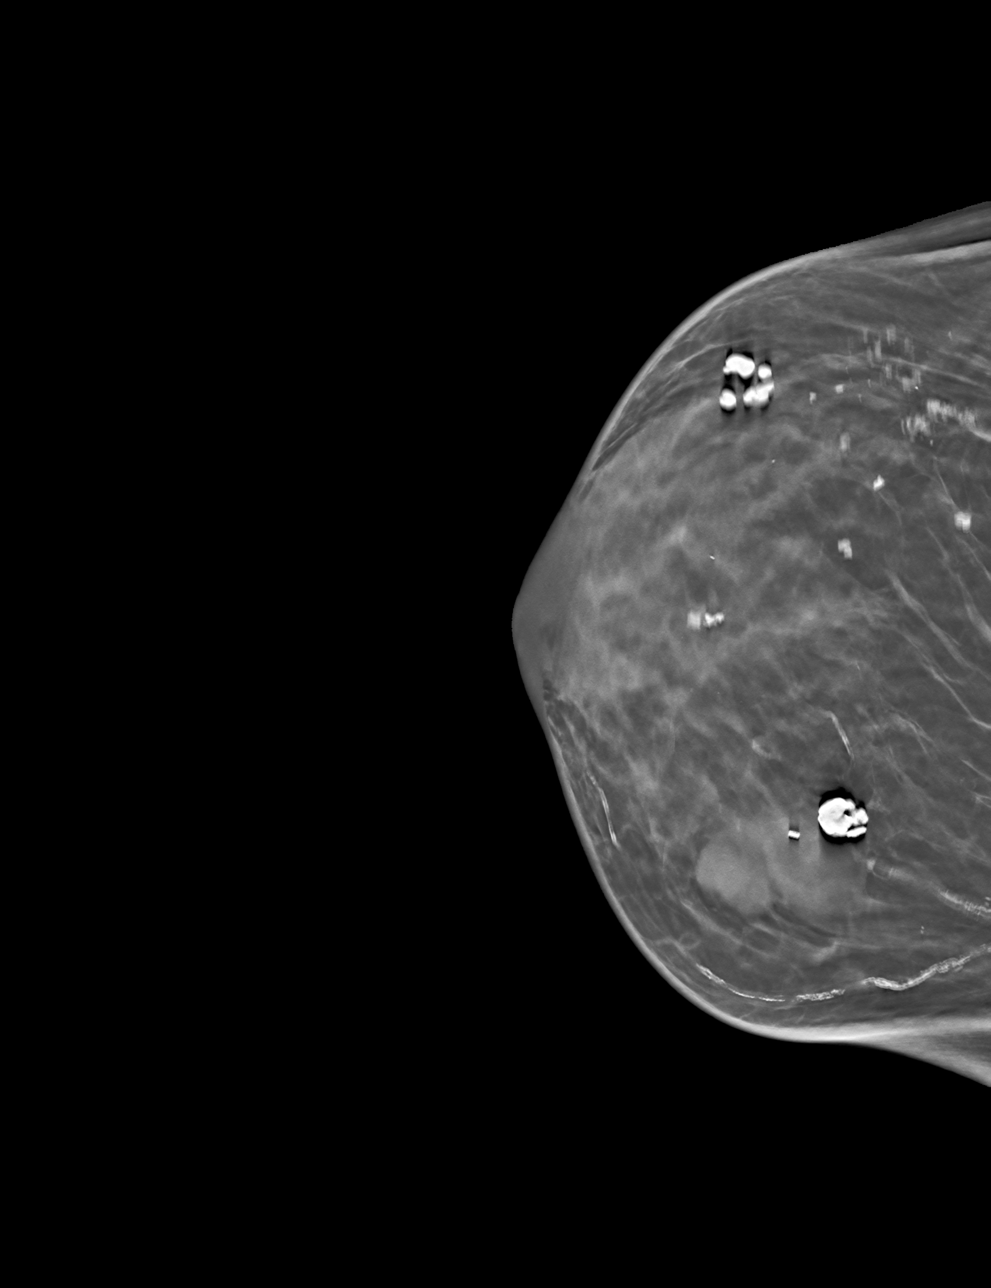

[4 of 12 positions shown; findings below may reference images not displayed]

ACR Breast Density Category c: The breast tissue is heterogeneously
dense, which may obscure small masses.
FINDINGS: Biopsy-proven cancer within the inner RIGHT breast has enlarged
compared to the previous study of 12/27/2017, now measuring
approximately 3 cm greatest dimension.

There are no new dominant masses, suspicious calcifications or
secondary signs of malignancy identified elsewhere within the RIGHT
breast.

Mammographic images were processed with CAD.

Targeted ultrasound is performed, showing the known RIGHT breast
cancer at the 1 o'clock axis, 4 cm from the nipple, with associated
biopsy clip, measuring 2.8 x 1.6 x 2.7 cm previously 2.4 x 1 x 1 8
cm.
IMPRESSION: Interval enlargement of the biopsy-proven RIGHT breast cancer at the
1 o'clock axis, 4 cm from the nipple, now measuring 2.8 x 1.6 x
cm (previously measuring 2.4 x 1 x 1.8 cm on ultrasound of
12/27/2017).

RECOMMENDATION:
Per treatment plan for patient's known RIGHT breast cancer.

I have discussed the findings and recommendations with the patient.
Results were also provided in writing at the conclusion of the
visit. If applicable, a reminder letter will be sent to the patient
regarding the next appointment.

BI-RADS CATEGORY  6: Known biopsy-proven malignancy.

## 2019-08-07 DIAGNOSIS — E119 Type 2 diabetes mellitus without complications: Secondary | ICD-10-CM | POA: Diagnosis not present

## 2019-08-07 DIAGNOSIS — Z515 Encounter for palliative care: Secondary | ICD-10-CM | POA: Diagnosis not present

## 2019-08-07 DIAGNOSIS — E785 Hyperlipidemia, unspecified: Secondary | ICD-10-CM | POA: Diagnosis not present

## 2019-08-07 DIAGNOSIS — B351 Tinea unguium: Secondary | ICD-10-CM | POA: Diagnosis not present

## 2019-08-07 DIAGNOSIS — I10 Essential (primary) hypertension: Secondary | ICD-10-CM | POA: Diagnosis not present

## 2019-08-07 DIAGNOSIS — E1159 Type 2 diabetes mellitus with other circulatory complications: Secondary | ICD-10-CM | POA: Diagnosis not present

## 2019-08-07 DIAGNOSIS — Z9181 History of falling: Secondary | ICD-10-CM | POA: Diagnosis not present

## 2019-08-08 DIAGNOSIS — Z23 Encounter for immunization: Secondary | ICD-10-CM | POA: Diagnosis not present

## 2019-08-08 DIAGNOSIS — U071 COVID-19: Secondary | ICD-10-CM | POA: Diagnosis not present

## 2019-08-15 IMAGING — US ULTRASOUND RIGHT BREAST LIMITED
1 series · 5 of 5 positions shown · non-contrast
Comparison: Previous exam(s).

CLINICAL DATA: [AGE] female with history of right breast
cancer diagnosed 09/08/2016 treated with medical therapy only.
Assess response to therapy.

EXAM:
2D DIGITAL DIAGNOSTIC UNILATERAL RIGHT MAMMOGRAM WITH CAD AND
ADJUNCT TOMO
RIGHT BREAST ULTRASOUND

[Series 1: ultrasound right breast limited · 0.07mm/px · 5 of 5 slices shown]
[im 1/5]
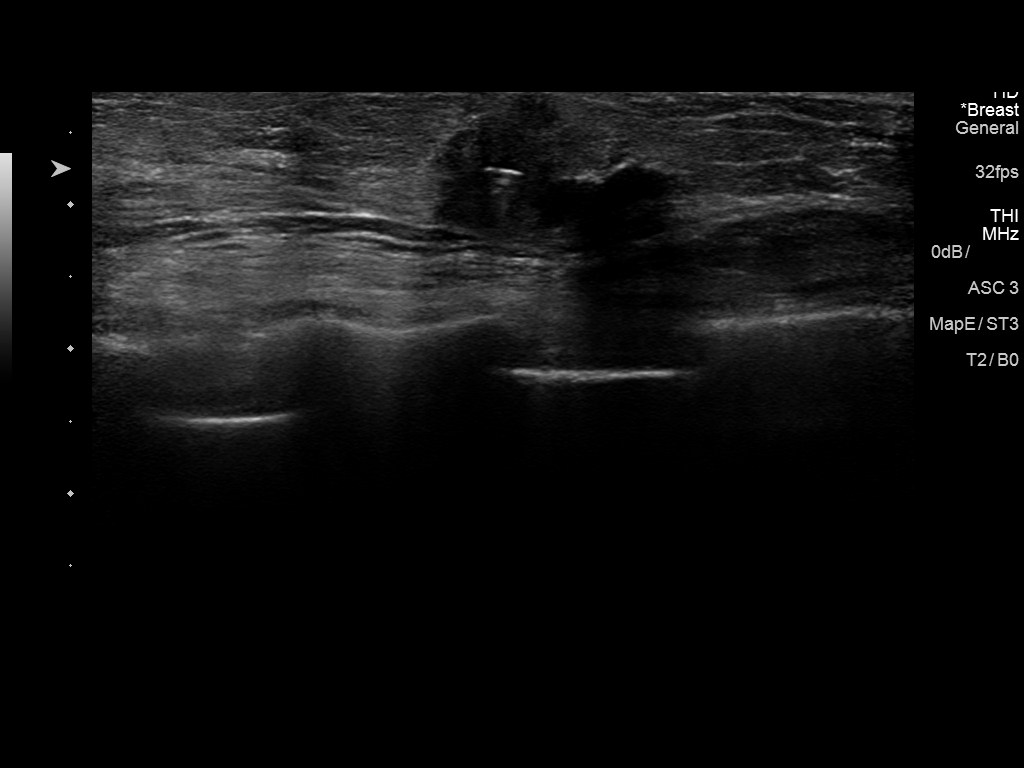
[im 2/5]
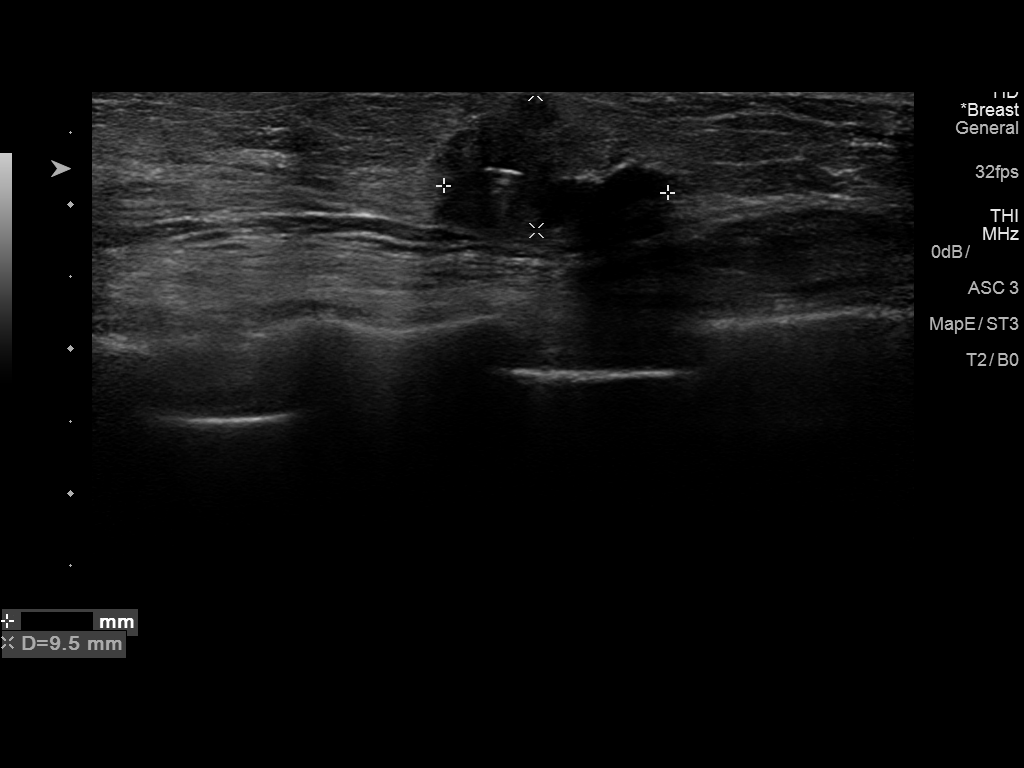
[im 3/5]
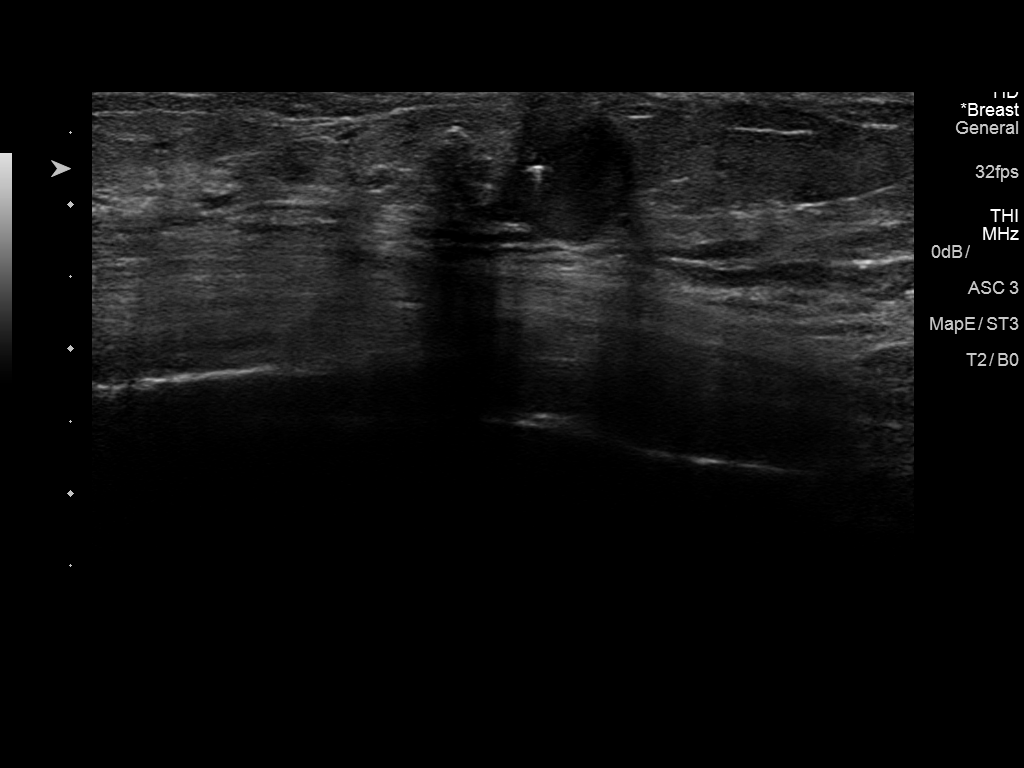
[im 4/5]
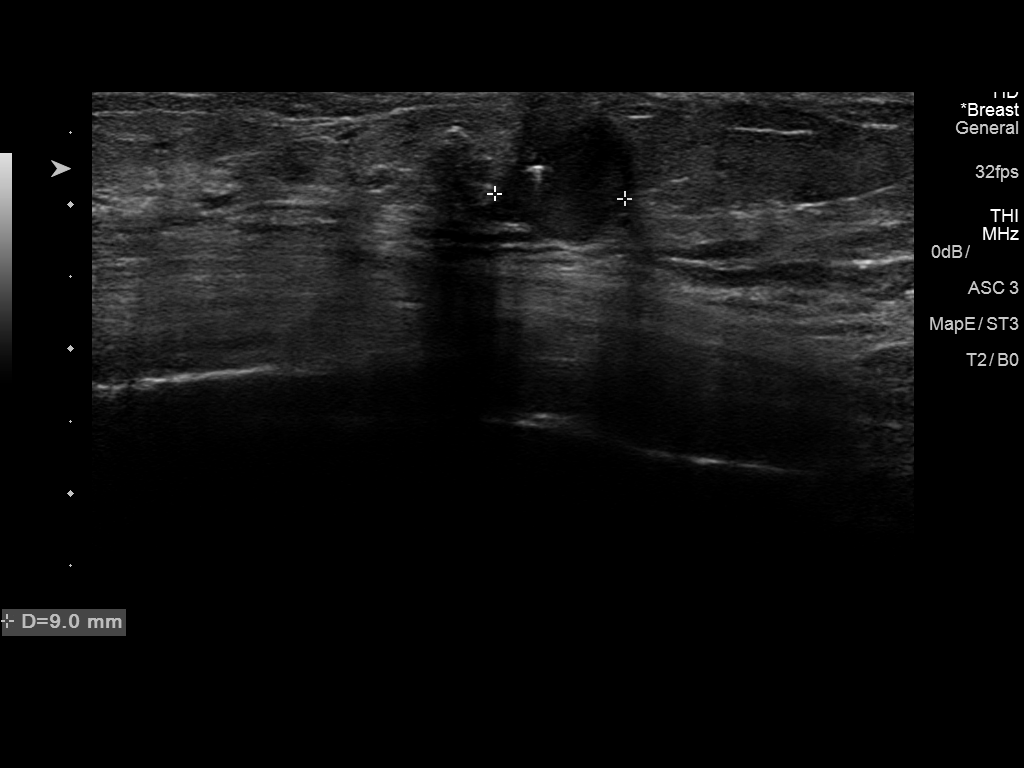
[im 5/5]
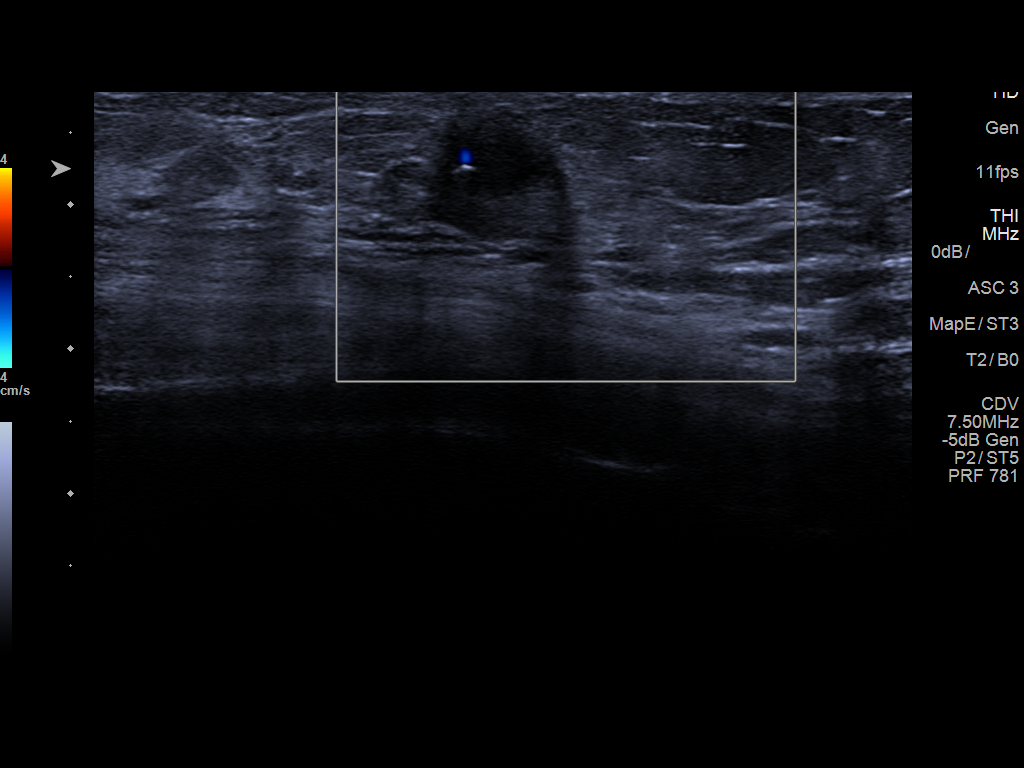

[5 of 5 positions shown; findings below may reference images not displayed]

ACR Breast Density Category c: The breast tissue is heterogeneously
dense, which may obscure small masses.
FINDINGS: A cylindrical shaped biopsy marking clip is present at the site of
known malignancy in the upper inner right breast, with the
spiculated mass mammographically measuring approximately 1.1 cm,
smaller in size. No new masses seen in the right breast. There is a
large coarse benign dystrophic type calcification adjacent to the
malignancy in the right breast which is likely palpable.

Mammographic images were processed with CAD.

Targeted ultrasound of the right breast was performed demonstrating
an irregular hypoechoic mass containing biopsy clip artifact at 1
o'clock 4 cm from nipple measuring 1.6 x 1 x 1.4 cm, previously
measured as 2.3 x 1.1 x 1.8 cm.
IMPRESSION: Known right breast malignancy has decreased in size in the interval
now measuring 1.6 x 1 x 1.4 cm, previously 2.3 x 1.1 x 1.8 cm.

RECOMMENDATION:
Treatment plan for known right breast malignancy.

I have discussed the findings and recommendations with the patient.
Results were also provided in writing at the conclusion of the
visit. If applicable, a reminder letter will be sent to the patient
regarding the next appointment.

BI-RADS CATEGORY  6: Known biopsy-proven malignancy.

## 2019-08-23 DIAGNOSIS — R0902 Hypoxemia: Secondary | ICD-10-CM | POA: Diagnosis not present

## 2019-09-03 DIAGNOSIS — R42 Dizziness and giddiness: Secondary | ICD-10-CM | POA: Diagnosis not present

## 2019-09-05 DIAGNOSIS — M545 Low back pain: Secondary | ICD-10-CM | POA: Diagnosis not present

## 2019-09-06 DIAGNOSIS — R531 Weakness: Secondary | ICD-10-CM | POA: Diagnosis not present

## 2019-09-06 DIAGNOSIS — I10 Essential (primary) hypertension: Secondary | ICD-10-CM | POA: Diagnosis not present

## 2019-09-06 DIAGNOSIS — I509 Heart failure, unspecified: Secondary | ICD-10-CM | POA: Diagnosis not present

## 2019-09-19 DIAGNOSIS — E119 Type 2 diabetes mellitus without complications: Secondary | ICD-10-CM | POA: Diagnosis not present

## 2019-09-19 DIAGNOSIS — Z20828 Contact with and (suspected) exposure to other viral communicable diseases: Secondary | ICD-10-CM | POA: Diagnosis not present

## 2019-09-24 DIAGNOSIS — Z20828 Contact with and (suspected) exposure to other viral communicable diseases: Secondary | ICD-10-CM | POA: Diagnosis not present

## 2019-09-27 DIAGNOSIS — E119 Type 2 diabetes mellitus without complications: Secondary | ICD-10-CM | POA: Diagnosis not present

## 2019-10-01 DIAGNOSIS — Z20828 Contact with and (suspected) exposure to other viral communicable diseases: Secondary | ICD-10-CM | POA: Diagnosis not present

## 2019-10-08 DIAGNOSIS — Z20828 Contact with and (suspected) exposure to other viral communicable diseases: Secondary | ICD-10-CM | POA: Diagnosis not present

## 2019-10-15 DIAGNOSIS — Z20828 Contact with and (suspected) exposure to other viral communicable diseases: Secondary | ICD-10-CM | POA: Diagnosis not present

## 2019-10-21 DIAGNOSIS — R0602 Shortness of breath: Secondary | ICD-10-CM | POA: Diagnosis not present

## 2019-10-22 DIAGNOSIS — Z20828 Contact with and (suspected) exposure to other viral communicable diseases: Secondary | ICD-10-CM | POA: Diagnosis not present

## 2019-10-29 DIAGNOSIS — Z20828 Contact with and (suspected) exposure to other viral communicable diseases: Secondary | ICD-10-CM | POA: Diagnosis not present

## 2019-11-25 DIAGNOSIS — Z23 Encounter for immunization: Secondary | ICD-10-CM | POA: Diagnosis not present

## 2019-11-26 DIAGNOSIS — Z20828 Contact with and (suspected) exposure to other viral communicable diseases: Secondary | ICD-10-CM | POA: Diagnosis not present

## 2019-11-27 DIAGNOSIS — I350 Nonrheumatic aortic (valve) stenosis: Secondary | ICD-10-CM | POA: Diagnosis not present

## 2019-11-27 DIAGNOSIS — E785 Hyperlipidemia, unspecified: Secondary | ICD-10-CM | POA: Diagnosis not present

## 2019-11-27 DIAGNOSIS — N390 Urinary tract infection, site not specified: Secondary | ICD-10-CM | POA: Diagnosis not present

## 2019-11-27 DIAGNOSIS — R531 Weakness: Secondary | ICD-10-CM | POA: Diagnosis not present

## 2019-12-03 DIAGNOSIS — Z20828 Contact with and (suspected) exposure to other viral communicable diseases: Secondary | ICD-10-CM | POA: Diagnosis not present

## 2019-12-10 DIAGNOSIS — Z20828 Contact with and (suspected) exposure to other viral communicable diseases: Secondary | ICD-10-CM | POA: Diagnosis not present

## 2019-12-13 DIAGNOSIS — E785 Hyperlipidemia, unspecified: Secondary | ICD-10-CM | POA: Diagnosis not present

## 2019-12-13 DIAGNOSIS — I509 Heart failure, unspecified: Secondary | ICD-10-CM | POA: Diagnosis not present

## 2019-12-13 DIAGNOSIS — R5383 Other fatigue: Secondary | ICD-10-CM | POA: Diagnosis not present

## 2019-12-13 DIAGNOSIS — R531 Weakness: Secondary | ICD-10-CM | POA: Diagnosis not present

## 2019-12-16 DIAGNOSIS — Z23 Encounter for immunization: Secondary | ICD-10-CM | POA: Diagnosis not present

## 2019-12-17 DIAGNOSIS — Z20828 Contact with and (suspected) exposure to other viral communicable diseases: Secondary | ICD-10-CM | POA: Diagnosis not present

## 2019-12-24 DIAGNOSIS — Z20828 Contact with and (suspected) exposure to other viral communicable diseases: Secondary | ICD-10-CM | POA: Diagnosis not present

## 2019-12-31 DIAGNOSIS — Z20828 Contact with and (suspected) exposure to other viral communicable diseases: Secondary | ICD-10-CM | POA: Diagnosis not present

## 2020-01-02 DIAGNOSIS — U071 COVID-19: Secondary | ICD-10-CM | POA: Diagnosis not present

## 2020-01-02 DIAGNOSIS — I509 Heart failure, unspecified: Secondary | ICD-10-CM | POA: Diagnosis not present

## 2020-01-02 DIAGNOSIS — E119 Type 2 diabetes mellitus without complications: Secondary | ICD-10-CM | POA: Diagnosis not present

## 2020-01-02 DIAGNOSIS — M545 Low back pain: Secondary | ICD-10-CM | POA: Diagnosis not present

## 2020-01-06 DIAGNOSIS — Z23 Encounter for immunization: Secondary | ICD-10-CM | POA: Diagnosis not present

## 2020-01-18 DIAGNOSIS — I10 Essential (primary) hypertension: Secondary | ICD-10-CM | POA: Diagnosis not present

## 2020-01-18 DIAGNOSIS — R410 Disorientation, unspecified: Secondary | ICD-10-CM | POA: Diagnosis not present

## 2020-01-18 DIAGNOSIS — K5900 Constipation, unspecified: Secondary | ICD-10-CM | POA: Diagnosis not present

## 2020-01-18 DIAGNOSIS — Z87440 Personal history of urinary (tract) infections: Secondary | ICD-10-CM | POA: Diagnosis not present

## 2020-01-24 DIAGNOSIS — Z20828 Contact with and (suspected) exposure to other viral communicable diseases: Secondary | ICD-10-CM | POA: Diagnosis not present

## 2020-01-31 DIAGNOSIS — Z20828 Contact with and (suspected) exposure to other viral communicable diseases: Secondary | ICD-10-CM | POA: Diagnosis not present

## 2020-02-28 DIAGNOSIS — E119 Type 2 diabetes mellitus without complications: Secondary | ICD-10-CM | POA: Diagnosis not present

## 2020-02-28 DIAGNOSIS — I10 Essential (primary) hypertension: Secondary | ICD-10-CM | POA: Diagnosis not present

## 2020-02-28 DIAGNOSIS — F0390 Unspecified dementia without behavioral disturbance: Secondary | ICD-10-CM | POA: Diagnosis not present

## 2020-02-28 DIAGNOSIS — K5900 Constipation, unspecified: Secondary | ICD-10-CM | POA: Diagnosis not present

## 2020-03-05 DIAGNOSIS — I350 Nonrheumatic aortic (valve) stenosis: Secondary | ICD-10-CM | POA: Diagnosis not present

## 2020-03-05 DIAGNOSIS — R531 Weakness: Secondary | ICD-10-CM | POA: Diagnosis not present

## 2020-03-05 DIAGNOSIS — R55 Syncope and collapse: Secondary | ICD-10-CM | POA: Diagnosis not present

## 2020-03-05 DIAGNOSIS — E785 Hyperlipidemia, unspecified: Secondary | ICD-10-CM | POA: Diagnosis not present

## 2020-03-18 DIAGNOSIS — M6284 Sarcopenia: Secondary | ICD-10-CM | POA: Diagnosis not present

## 2020-03-18 DIAGNOSIS — Z9681 Presence of artificial skin: Secondary | ICD-10-CM | POA: Diagnosis not present

## 2020-03-18 DIAGNOSIS — I10 Essential (primary) hypertension: Secondary | ICD-10-CM | POA: Diagnosis not present

## 2020-03-18 DIAGNOSIS — E1159 Type 2 diabetes mellitus with other circulatory complications: Secondary | ICD-10-CM | POA: Diagnosis not present

## 2020-03-18 DIAGNOSIS — B351 Tinea unguium: Secondary | ICD-10-CM | POA: Diagnosis not present

## 2020-03-19 DIAGNOSIS — E119 Type 2 diabetes mellitus without complications: Secondary | ICD-10-CM | POA: Diagnosis not present

## 2020-03-19 DIAGNOSIS — I10 Essential (primary) hypertension: Secondary | ICD-10-CM | POA: Diagnosis not present

## 2020-04-08 DIAGNOSIS — I350 Nonrheumatic aortic (valve) stenosis: Secondary | ICD-10-CM | POA: Diagnosis not present

## 2020-04-08 DIAGNOSIS — I509 Heart failure, unspecified: Secondary | ICD-10-CM | POA: Diagnosis not present

## 2020-04-08 DIAGNOSIS — R55 Syncope and collapse: Secondary | ICD-10-CM | POA: Diagnosis not present

## 2020-04-08 DIAGNOSIS — R531 Weakness: Secondary | ICD-10-CM | POA: Diagnosis not present

## 2020-04-22 DIAGNOSIS — R55 Syncope and collapse: Secondary | ICD-10-CM | POA: Diagnosis not present

## 2020-04-22 DIAGNOSIS — I350 Nonrheumatic aortic (valve) stenosis: Secondary | ICD-10-CM | POA: Diagnosis not present

## 2020-04-22 DIAGNOSIS — I509 Heart failure, unspecified: Secondary | ICD-10-CM | POA: Diagnosis not present

## 2020-04-22 DIAGNOSIS — E785 Hyperlipidemia, unspecified: Secondary | ICD-10-CM | POA: Diagnosis not present

## 2020-04-28 DIAGNOSIS — J90 Pleural effusion, not elsewhere classified: Secondary | ICD-10-CM | POA: Diagnosis not present

## 2020-04-28 DIAGNOSIS — I509 Heart failure, unspecified: Secondary | ICD-10-CM | POA: Diagnosis not present

## 2020-04-28 DIAGNOSIS — R918 Other nonspecific abnormal finding of lung field: Secondary | ICD-10-CM | POA: Diagnosis not present

## 2020-05-28 DIAGNOSIS — R531 Weakness: Secondary | ICD-10-CM | POA: Diagnosis not present

## 2020-05-28 DIAGNOSIS — R55 Syncope and collapse: Secondary | ICD-10-CM | POA: Diagnosis not present

## 2020-05-28 DIAGNOSIS — I509 Heart failure, unspecified: Secondary | ICD-10-CM | POA: Diagnosis not present

## 2020-05-28 DIAGNOSIS — I350 Nonrheumatic aortic (valve) stenosis: Secondary | ICD-10-CM | POA: Diagnosis not present

## 2020-06-02 DIAGNOSIS — E785 Hyperlipidemia, unspecified: Secondary | ICD-10-CM | POA: Diagnosis not present

## 2020-06-02 DIAGNOSIS — I350 Nonrheumatic aortic (valve) stenosis: Secondary | ICD-10-CM | POA: Diagnosis not present

## 2020-06-02 DIAGNOSIS — I509 Heart failure, unspecified: Secondary | ICD-10-CM | POA: Diagnosis not present

## 2020-06-02 DIAGNOSIS — R55 Syncope and collapse: Secondary | ICD-10-CM | POA: Diagnosis not present

## 2020-06-03 DIAGNOSIS — R531 Weakness: Secondary | ICD-10-CM | POA: Diagnosis not present

## 2020-06-03 DIAGNOSIS — I509 Heart failure, unspecified: Secondary | ICD-10-CM | POA: Diagnosis not present

## 2020-06-03 DIAGNOSIS — I350 Nonrheumatic aortic (valve) stenosis: Secondary | ICD-10-CM | POA: Diagnosis not present

## 2020-06-03 DIAGNOSIS — R55 Syncope and collapse: Secondary | ICD-10-CM | POA: Diagnosis not present

## 2020-06-22 DEATH — deceased
